# Patient Record
Sex: Female | Born: 1975 | Race: Black or African American | Hispanic: No | Marital: Single | Smoking: Former smoker
Health system: Southern US, Community
[De-identification: ages and names within clinical notes are randomized; demographics above are authoritative.]

## PROBLEM LIST (undated history)

## (undated) DIAGNOSIS — F329 Major depressive disorder, single episode, unspecified: Secondary | ICD-10-CM

## (undated) DIAGNOSIS — F32A Depression, unspecified: Secondary | ICD-10-CM

## (undated) DIAGNOSIS — E785 Hyperlipidemia, unspecified: Secondary | ICD-10-CM

## (undated) DIAGNOSIS — E119 Type 2 diabetes mellitus without complications: Secondary | ICD-10-CM

## (undated) DIAGNOSIS — I10 Essential (primary) hypertension: Secondary | ICD-10-CM

## (undated) DIAGNOSIS — F419 Anxiety disorder, unspecified: Secondary | ICD-10-CM

## (undated) HISTORY — DX: Hyperlipidemia, unspecified: E78.5

## (undated) HISTORY — DX: Major depressive disorder, single episode, unspecified: F32.9

## (undated) HISTORY — DX: Essential (primary) hypertension: I10

## (undated) HISTORY — DX: Anxiety disorder, unspecified: F41.9

## (undated) HISTORY — DX: Depression, unspecified: F32.A

## (undated) HISTORY — DX: Type 2 diabetes mellitus without complications: E11.9

---

## 2014-06-30 ENCOUNTER — Telehealth: Payer: Self-pay | Admitting: Family Medicine

## 2014-06-30 NOTE — Telephone Encounter (Signed)
Patient has medicaid and she is currently on lisinopril, metoprolol, novolog, prozac. Appointment scheduled for 5/10 with Jannifer Rodneyhristy Hawks, FNP.

## 2014-08-22 ENCOUNTER — Telehealth: Payer: Self-pay | Admitting: Family Medicine

## 2014-08-22 NOTE — Telephone Encounter (Signed)
[facility]10[facility]

## 2014-08-23 ENCOUNTER — Ambulatory Visit: Payer: Self-pay | Admitting: Family

## 2014-09-06 LAB — HM DIABETES EYE EXAM

## 2014-09-19 ENCOUNTER — Encounter: Payer: Self-pay | Admitting: *Deleted

## 2014-09-19 ENCOUNTER — Telehealth: Payer: Self-pay | Admitting: Family

## 2014-09-19 ENCOUNTER — Ambulatory Visit: Payer: Self-pay | Admitting: Family

## 2014-09-19 NOTE — Telephone Encounter (Signed)
Appointment given for 7/12 with Jannifer Rodneyhristy Hawks, FNP.

## 2014-09-23 ENCOUNTER — Encounter: Payer: Self-pay | Admitting: Family

## 2014-10-25 ENCOUNTER — Encounter: Payer: Self-pay | Admitting: Physician Assistant

## 2014-10-25 ENCOUNTER — Encounter (INDEPENDENT_AMBULATORY_CARE_PROVIDER_SITE_OTHER): Payer: Self-pay

## 2014-10-25 ENCOUNTER — Ambulatory Visit (INDEPENDENT_AMBULATORY_CARE_PROVIDER_SITE_OTHER): Payer: Medicaid Other | Admitting: Physician Assistant

## 2014-10-25 VITALS — BP 138/93 | HR 95 | Temp 97.0°F | Ht 63.0 in | Wt 212.0 lb

## 2014-10-25 DIAGNOSIS — E785 Hyperlipidemia, unspecified: Secondary | ICD-10-CM

## 2014-10-25 DIAGNOSIS — E1169 Type 2 diabetes mellitus with other specified complication: Secondary | ICD-10-CM | POA: Diagnosis not present

## 2014-10-25 DIAGNOSIS — E669 Obesity, unspecified: Secondary | ICD-10-CM | POA: Diagnosis not present

## 2014-10-25 DIAGNOSIS — I10 Essential (primary) hypertension: Secondary | ICD-10-CM | POA: Diagnosis not present

## 2014-10-25 DIAGNOSIS — F329 Major depressive disorder, single episode, unspecified: Secondary | ICD-10-CM | POA: Insufficient documentation

## 2014-10-25 DIAGNOSIS — F418 Other specified anxiety disorders: Secondary | ICD-10-CM

## 2014-10-25 DIAGNOSIS — E119 Type 2 diabetes mellitus without complications: Secondary | ICD-10-CM

## 2014-10-25 DIAGNOSIS — Z794 Long term (current) use of insulin: Secondary | ICD-10-CM

## 2014-10-25 DIAGNOSIS — R000 Tachycardia, unspecified: Secondary | ICD-10-CM | POA: Diagnosis not present

## 2014-10-25 DIAGNOSIS — F419 Anxiety disorder, unspecified: Secondary | ICD-10-CM

## 2014-10-25 DIAGNOSIS — F32A Depression, unspecified: Secondary | ICD-10-CM

## 2014-10-25 LAB — POCT CBC
Granulocyte percent: 56.3 %G (ref 37–80)
HCT, POC: 41.1 % (ref 37.7–47.9)
Hemoglobin: 12.9 g/dL (ref 12.2–16.2)
LYMPH, POC: 2.9 (ref 0.6–3.4)
MCH: 26.1 pg — AB (ref 27–31.2)
MCHC: 31.5 g/dL — AB (ref 31.8–35.4)
MCV: 82.7 fL (ref 80–97)
MPV: 6.9 fL (ref 0–99.8)
POC Granulocyte: 4.2 (ref 2–6.9)
POC LYMPH %: 38.6 % (ref 10–50)
Platelet Count, POC: 483 10*3/uL — AB (ref 142–424)
RBC: 4.97 M/uL (ref 4.04–5.48)
RDW, POC: 14 %
WBC: 7.4 10*3/uL (ref 4.6–10.2)

## 2014-10-25 LAB — POCT GLYCOSYLATED HEMOGLOBIN (HGB A1C): Hemoglobin A1C: 13.3

## 2014-10-25 MED ORDER — FLUOXETINE HCL 20 MG PO TABS: 20.0000 mg | ORAL_TABLET | Freq: Every day | ORAL | Status: DC

## 2014-10-25 MED ORDER — METOPROLOL SUCCINATE ER 25 MG PO TB24: 25.0000 mg | ORAL_TABLET | Freq: Every day | ORAL | Status: DC

## 2014-10-25 MED ORDER — ALPRAZOLAM 0.25 MG PO TABS: 0.2500 mg | ORAL_TABLET | Freq: Two times a day (BID) | ORAL | Status: DC | PRN

## 2014-10-25 NOTE — Progress Notes (Signed)
Subjective:    Patient ID: Kiara Mccall, female    DOB: 1976/01/24, 39 y.o.   MRN: [id number]  HPI 39 y/o female presents for establishment of care. She has previously diagnosed HTN, DM type 2 (insulin dependent), hyperlipidemia and anxiety with depressed mood. She has not been treated in 6 months because her Dr said that he could not see her anymore d/t noncompliance of follow up according to patient.   She occasionally takes her stepdad's antihypertensive, Metoprolol 84m and Lisinopril 167m- last dose was yesterday.   Strong family h/o kidney disease, HTN, DM with secondary blindness.    Review of Systems  Constitutional: Negative for fever, chills, diaphoresis, fatigue and unexpected weight change.  Eyes:       Recent eye exam 2 months ago. WNL    Respiratory: Positive for cough and shortness of breath (when outside ).   Cardiovascular: Positive for palpitations. Negative for chest pain and leg swelling.  Gastrointestinal: Negative.  Negative for abdominal pain, constipation and abdominal distention.  Endocrine: Positive for polydipsia. Negative for cold intolerance, heat intolerance, polyphagia and polyuria.  Genitourinary: Positive for urgency. Negative for dysuria, frequency, hematuria, vaginal bleeding and vaginal discharge.  Musculoskeletal: Negative.   Skin: Negative.   Allergic/Immunologic: Negative.   Neurological: Positive for light-headedness and headaches. Negative for dizziness and syncope.  Hematological: Negative.   Psychiatric/Behavioral: Positive for dysphoric mood. Negative for suicidal ideas. The patient is nervous/anxious.        Objective:   Physical Exam  Constitutional: She is oriented to person, place, and time. She appears well-developed and well-nourished.  HENT:  Head: Normocephalic and atraumatic.  Cardiovascular: Normal rate, regular rhythm, normal heart sounds and intact distal pulses.  Exam reveals no gallop and no friction rub.   No  murmur heard. Pulmonary/Chest: Effort normal and breath sounds normal. No respiratory distress. She has no wheezes. She has no rales. She exhibits no tenderness.  Musculoskeletal: Normal range of motion. She exhibits no edema or tenderness.  Neurological: She is alert and oriented to person, place, and time. She displays normal reflexes. No cranial nerve deficit. She exhibits normal muscle tone. Coordination normal.  5 point discrimination WNL bilateral feet   Psychiatric: She has a normal mood and affect. Her behavior is normal. Judgment and thought content normal.  Nursing note and vitals reviewed.         Assessment & Plan:  1. Type 2 diabetes mellitus treated with insulin  - POCT CBC - Thyroid Panel With TSH - CMP14+EGFR - POCT glycosylated hemoglobin (Hb A1C)  2. Essential hypertension  - Hepatic function panel - metoprolol succinate (TOPROL-XL) 25 MG 24 hr tablet; Take 1 tablet (25 mg total) by mouth daily.  Dispense: 30 tablet; Refill: 3  3. Hyperlipidemia associated with type 2 diabetes mellitus  - Lipid panel  4. Diabetes mellitus type 2, insulin dependent   5. Hyperlipidemia   6. Anxiety and depression  - FLUoxetine (PROZAC) 20 MG tablet; Take 1 tablet (20 mg total) by mouth daily.  Dispense: 30 tablet; Refill: 3 - ALPRAZolam (XANAX) 0.25 MG tablet; Take 1 tablet (0.25 mg total) by mouth 2 (two) times daily as needed for anxiety.  Dispense: 60 tablet; Refill: 1  7. Obesity   8. Tachycardia  - metoprolol succinate (TOPROL-XL) 25 MG 24 hr tablet; Take 1 tablet (25 mg total) by mouth daily.  Dispense: 30 tablet; Refill: 3  Will have patient f/u with me in 2 weeks to recheck BP and with  Clinical pharmacist asap for diabetic mgmt pending lab results.   Albaraa Swingle A. Benjamin Stain PA-C

## 2014-10-26 LAB — THYROID PANEL WITH TSH
Free Thyroxine Index: 2.2 (ref 1.2–4.9)
T3 Uptake Ratio: 28 % (ref 24–39)
T4, Total: 8 ug/dL (ref 4.5–12.0)
TSH: 1.32 u[IU]/mL (ref 0.450–4.500)

## 2014-10-26 LAB — CMP14+EGFR
A/G RATIO: 1.1 (ref 1.1–2.5)
ALBUMIN: 3.7 g/dL (ref 3.5–5.5)
ALK PHOS: 76 IU/L (ref 39–117)
ALT: 13 IU/L (ref 0–32)
AST: 11 IU/L (ref 0–40)
BILIRUBIN TOTAL: 0.3 mg/dL (ref 0.0–1.2)
BUN / CREAT RATIO: 17 (ref 8–20)
BUN: 9 mg/dL (ref 6–20)
CALCIUM: 9.1 mg/dL (ref 8.7–10.2)
CO2: 22 mmol/L (ref 18–29)
Chloride: 96 mmol/L — ABNORMAL LOW (ref 97–108)
Creatinine, Ser: 0.52 mg/dL — ABNORMAL LOW (ref 0.57–1.00)
GFR calc non Af Amer: 122 mL/min/{1.73_m2} (ref >59)
GFR, EST AFRICAN AMERICAN: 141 mL/min/{1.73_m2} (ref >59)
Globulin, Total: 3.3 g/dL (ref 1.5–4.5)
Glucose: 341 mg/dL — ABNORMAL HIGH (ref 65–99)
POTASSIUM: 4.3 mmol/L (ref 3.5–5.2)
SODIUM: 135 mmol/L (ref 134–144)
TOTAL PROTEIN: 7 g/dL (ref 6.0–8.5)

## 2014-10-26 LAB — HEPATIC FUNCTION PANEL: Bilirubin, Direct: 0.1 mg/dL (ref 0.00–0.40)

## 2014-10-26 LAB — LIPID PANEL
CHOL/HDL RATIO: 7.2 ratio — AB (ref 0.0–4.4)
CHOLESTEROL TOTAL: 230 mg/dL — AB (ref 100–199)
HDL: 32 mg/dL — ABNORMAL LOW (ref >39)
LDL Calculated: 162 mg/dL — ABNORMAL HIGH (ref 0–99)
TRIGLYCERIDES: 179 mg/dL — AB (ref 0–149)
VLDL Cholesterol Cal: 36 mg/dL (ref 5–40)

## 2014-10-28 ENCOUNTER — Other Ambulatory Visit: Payer: Self-pay | Admitting: Family

## 2014-10-28 MED ORDER — METFORMIN HCL 1000 MG PO TABS: 1000.0000 mg | ORAL_TABLET | Freq: Two times a day (BID) | ORAL | Status: DC

## 2014-10-28 MED ORDER — ATORVASTATIN CALCIUM 40 MG PO TABS: 40.0000 mg | ORAL_TABLET | Freq: Every day | ORAL | Status: DC

## 2014-11-09 ENCOUNTER — Encounter: Payer: Self-pay | Admitting: Family

## 2014-11-09 ENCOUNTER — Ambulatory Visit (INDEPENDENT_AMBULATORY_CARE_PROVIDER_SITE_OTHER): Payer: Medicaid Other | Admitting: Family

## 2014-11-09 VITALS — BP 138/96 | HR 96 | Temp 97.4°F | Ht 63.0 in | Wt 206.4 lb

## 2014-11-09 DIAGNOSIS — Z794 Long term (current) use of insulin: Secondary | ICD-10-CM

## 2014-11-09 DIAGNOSIS — E119 Type 2 diabetes mellitus without complications: Secondary | ICD-10-CM

## 2014-11-09 DIAGNOSIS — I10 Essential (primary) hypertension: Secondary | ICD-10-CM

## 2014-11-09 MED ORDER — INSULIN LISPRO 100 UNIT/ML ~~LOC~~ SOLN: 20.0000 [IU] | Freq: Three times a day (TID) | SUBCUTANEOUS | Status: DC

## 2014-11-09 MED ORDER — LISINOPRIL 20 MG PO TABS: 20.0000 mg | ORAL_TABLET | Freq: Every day | ORAL | Status: DC

## 2014-11-09 NOTE — Progress Notes (Signed)
Subjective:    Patient ID: Kiara Mccall, female    DOB: 06-22-1975, 39 y.o.   MRN: [id number]  Pt presents to the office for 2 week recheck for diabetes and hypertension. PT has been a diabetic for years, but did not have a doctor so she stopped all of her medication including her metformin and Humalog 40 units TID. Pt was restarted on her metformin, but states she does not have a blood glucose meter at home. Pt has an appt with Tammy August 1.  Diabetes She presents for her follow-up diabetic visit. She has type 2 diabetes mellitus. Her disease course has been worsening. Pertinent negatives for hypoglycemia include no confusion, dizziness or headaches. Pertinent negatives for diabetes include no blurred vision, no foot paresthesias, no foot ulcerations and no visual change. Symptoms are worsening. Pertinent negatives for diabetic complications include no CVA, heart disease, nephropathy or peripheral neuropathy. Risk factors for coronary artery disease include diabetes mellitus, obesity, family history and dyslipidemia. Current diabetic treatment includes oral agent (dual therapy). She is compliant with treatment some of the time. An ACE inhibitor/angiotensin II receptor blocker is not being taken. Eye exam is current.  Hypertension This is a chronic problem. The current episode started more than 1 year ago. The problem has been waxing and waning since onset. The problem is uncontrolled. Associated symptoms include palpitations. Pertinent negatives include no anxiety, blurred vision, headaches, peripheral edema or shortness of breath. Risk factors for coronary artery disease include diabetes mellitus, dyslipidemia, family history, obesity and sedentary lifestyle. Past treatments include beta blockers. The current treatment provides mild improvement. There is no history of kidney disease, CAD/MI, CVA or heart failure.      Review of Systems  Constitutional: Negative.   HENT: Negative.   Eyes:  Negative.  Negative for blurred vision.  Respiratory: Negative.  Negative for shortness of breath.   Cardiovascular: Positive for palpitations.  Gastrointestinal: Negative.   Endocrine: Negative.   Genitourinary: Negative.   Musculoskeletal: Negative.   Neurological: Negative.  Negative for dizziness and headaches.  Hematological: Negative.   Psychiatric/Behavioral: Negative.  Negative for confusion.  All other systems reviewed and are negative.      Objective:   Physical Exam  Constitutional: She is oriented to person, place, and time. She appears well-developed and well-nourished. No distress.  HENT:  Head: Normocephalic and atraumatic.  Right Ear: External ear normal.  Left Ear: External ear normal.  Nose: Nose normal.  Mouth/Throat: Oropharynx is clear and moist.  Eyes: Pupils are equal, round, and reactive to light.  Neck: Normal range of motion. Neck supple. No thyromegaly present.  Cardiovascular: Normal rate, regular rhythm, normal heart sounds and intact distal pulses.   No murmur heard. Pulmonary/Chest: Effort normal and breath sounds normal. No respiratory distress. She has no wheezes.  Abdominal: Soft. Bowel sounds are normal. She exhibits no distension. There is no tenderness.  Musculoskeletal: Normal range of motion. She exhibits no edema or tenderness.  Neurological: She is alert and oriented to person, place, and time. She has normal reflexes. No cranial nerve deficit.  Skin: Skin is warm and dry.  Psychiatric: She has a normal mood and affect. Her behavior is normal. Judgment and thought content normal.  Vitals reviewed.   BP 138/96 mmHg  Pulse 96  Temp(Src) 97.4 F (36.3 C) (Oral)  Ht _0  (1.6 m)  Wt 206 lb 6.4 oz (93.622 kg)  BMI 36.57 kg/m2  LMP 10/01/2014  See Diabetic foot note  Assessment & Plan:  1. Diabetes mellitus type 2, insulin dependent -PT states she was on Humalog 40 units TID 6 months ago- I restarted Humalog 20 units TID -I  gave pt blood glucose monitor and help pt set meter up -Encouraged low carb diet -keep appt with Clinical pharmacists  - POCT glycosylated hemoglobin (Hb A1C) - POCT UA - Microalbumin - CMP14+EGFR - lisinopril (PRINIVIL,ZESTRIL) 20 MG tablet; Take 1 tablet (20 mg total) by mouth daily.  Dispense: 90 tablet; Refill: 3 - insulin lispro (HUMALOG) 100 UNIT/ML injection; Inject 0.2 mLs (20 Units total) into the skin 3 (three) times daily with meals.  Dispense: 10 mL; Refill: 11  2. Essential hypertension -Pt started on Lisinopril -Dash diet information given -Exercise encouraged - Stress Management  -Continue current meds -RTO in 2 weeks - CMP14+EGFR - lisinopril (PRINIVIL,ZESTRIL) 20 MG tablet; Take 1 tablet (20 mg total) by mouth daily.  Dispense: 90 tablet; Refill: 3   Continue all meds Labs pending Health Maintenance reviewed Diet and exercise encouraged RTO 2 weeks for HTN and diabetes- Keep appt with Clinical Pharmacists   Evelina Dun, FNP

## 2014-11-09 NOTE — Patient Instructions (Signed)

## 2014-11-14 ENCOUNTER — Telehealth: Payer: Self-pay | Admitting: Pharmacist

## 2014-11-14 ENCOUNTER — Ambulatory Visit: Payer: Medicaid Other

## 2014-11-14 NOTE — Telephone Encounter (Signed)
Appt is made for August 5th at [facility] and patient is notified.

## 2014-11-18 ENCOUNTER — Encounter: Payer: Self-pay | Admitting: Pharmacist

## 2014-11-18 ENCOUNTER — Ambulatory Visit (INDEPENDENT_AMBULATORY_CARE_PROVIDER_SITE_OTHER): Payer: Medicaid Other | Admitting: Pharmacist

## 2014-11-18 DIAGNOSIS — E669 Obesity, unspecified: Secondary | ICD-10-CM

## 2014-11-18 DIAGNOSIS — E119 Type 2 diabetes mellitus without complications: Secondary | ICD-10-CM | POA: Diagnosis not present

## 2014-11-18 DIAGNOSIS — Z794 Long term (current) use of insulin: Secondary | ICD-10-CM

## 2014-11-18 DIAGNOSIS — I10 Essential (primary) hypertension: Secondary | ICD-10-CM

## 2014-11-18 DIAGNOSIS — E785 Hyperlipidemia, unspecified: Secondary | ICD-10-CM

## 2014-11-18 MED ORDER — ACCU-CHEK AVIVA PLUS W/DEVICE KIT: PACK | Status: DC

## 2014-11-18 MED ORDER — LANCETS MICRO THIN 33G MISC: Status: DC

## 2014-11-18 MED ORDER — GLUCOSE BLOOD VI STRP: ORAL_STRIP | Status: DC

## 2014-11-18 MED ORDER — ASPIRIN EC 81 MG PO TBEC: 81.0000 mg | DELAYED_RELEASE_TABLET | Freq: Every day | ORAL | Status: AC

## 2014-11-18 MED ORDER — INSULIN GLARGINE 100 UNIT/ML SOLOSTAR PEN: 20.0000 [IU] | PEN_INJECTOR | Freq: Every day | SUBCUTANEOUS | Status: DC

## 2014-11-18 NOTE — Progress Notes (Signed)
Subjective:    Kiara Mccall is a 39 y.o. female who presents for an initial evaluation of diabetes mellitus.  She was initially diagnosed with diabetes 2 years ago.  It is unclear if she has type 2 or type 1 DM as she has always been prescribed insulin.  When she presented to our office 2 weeks ago she has been out of all medications for over 1 month.   Current symptoms/problems include hyperglycemia and nausea and have been unchanged.   Known diabetic complications: none Cardiovascular risk factors: diabetes mellitus, dyslipidemia, family history of premature cardiovascular disease, hypertension and obesity (BMI >= 30 kg/m2) Current diabetic medications include metformin  1 tablet bid and humalog 20 units tid prior to meals.  Patient had taken Lantus for a short period about 1 year ago..   Eye exam current (within one year): yes  - per patient, requesting records from Dr Mayford Knife in [place].  Weight trend: increasing steadily Prior visit with dietician: no Current diet: in general, an "unhealthy" diet Current exercise: none  Current monitoring regimen: home blood tests - three times daily Home blood sugar records: patient reports BG 153 - 220 over the last week Any episodes of hypoglycemia? No reading less than 70 but patient feels that BG is low when it is in the 150's probably because she has had very elevated BG for a very long time and her body is not use to BG close to normal.  Is She on ACE inhibitor or angiotensin II receptor blocker?  Yes  lisinopril (Prinivil)    The following portions of the patient's history were reviewed and updated as appropriate: allergies, current medications, past family history, past medical history, past social history, past surgical history and problem list.   Objective:    LMP 10/01/2014  Lab Review GLUCOSE (mg/dL)  Date Value  16/01/9603 341*   CO2 (mmol/L)  Date Value  10/25/2014 22   BUN (mg/dL)  Date Value  54/12/8117 9    CREATININE, SER (mg/dL)  Date Value  14/78/2956 0.52*   A1c = 13.3% (10/25/2014) FBG in office today = 257  ssessment:    Diabetes Mellitus type II, under inadequate control.   HTN Hyperlipidemia  Plan:    1.  Rx changes as as follows:  Start Lantus 20 units once daily  Change Humalog to 10 units prior to meals if BG is 150 or higher  Add ASA  take 1 tablet daily  Continue other medications as instructed  Rx given for Aviva plus glucometer which is preferred by medicaid - with test strips and lancet - patient is to continue to check BG tid prior to each meal and if needed for suspected hyper or hypoglycemia. 2.  Education: Reviewed 'ABCs' of diabetes management (respective goals in parentheses):  A1C (<7), blood pressure (<130/80), and cholesterol (LDL <100). 3.  Spent 30 minutes educating about CHO content of various food and discussing effects on BG.  Education provided about recommended serving sizes.  Recommended CHO content of meals be 45 to 60 grams of CHO and for snack 15 grams of CHO or less.   4.  Discussed pregnancy prevention.  As patient is still able to become pregnant I discussed with her that she is taking 2 medications - statin and ACE that are not recommended during pregnancy.  Discuss options for pregnancy prevention.  5.  Increase physical activity - recommended 150 minutes weekly 6.  Checking GAD antibiody and C-Peptide to clarify type of diabetes (1 vs  2)   Orders Placed This Encounter  Procedures  . Glutamic acid decarboxylase auto abs  . C-peptide    7.  RTC in 4 days to see PCP.  I will see patient back in about 1 month or sooner if needed.  She is advised to call office if she has any questions or concerns.  Henrene Pastor, PharmD, CPP, CDE

## 2014-11-18 NOTE — Patient Instructions (Addendum)
Diabetes and Standards of Medical Care   Diabetes is complicated. You may find that your diabetes team includes a dietitian, nurse, diabetes educator, eye doctor, and more. To help everyone know what is going on and to help you get the care you deserve, the following schedule of care was developed to help keep you on track. Below are the tests, exams, vaccines, medicines, education, and plans you will need.  Blood Glucose Goals Prior to meals = 80 - 130 Within 2 hours of the start of a meal = less than 180  HbA1c test (goal is less than 7.0% - your last value was %) This test shows how well you have controlled your glucose over the past 2 to 3 months. It is used to see if your diabetes management plan needs to be adjusted.   It is performed at least 2 times a year if you are meeting treatment goals.  It is performed 4 times a year if therapy has changed or if you are not meeting treatment goals.  Blood pressure test  This test is performed at every routine medical visit. The goal is less than 140/90 mmHg for most people, but 130/80 mmHg in some cases. Ask your health care provider about your goal.  Dental exam  Follow up with the dentist regularly.  Eye exam  If you are diagnosed with type 1 diabetes as a child, get an exam upon reaching the age of 10 years or older and have had diabetes for 3 to 5 years. Yearly eye exams are recommended after that initial eye exam.  If you are diagnosed with type 1 diabetes as an adult, get an exam within 5 years of diagnosis and then yearly.  If you are diagnosed with type 2 diabetes, get an exam as soon as possible after the diagnosis and then yearly.  Foot care exam  Visual foot exams are performed at every routine medical visit. The exams check for cuts, injuries, or other problems with the feet.  A comprehensive foot exam should be done yearly. This includes visual inspection as well as assessing foot pulses and testing for loss of  sensation.  Check your feet nightly for cuts, injuries, or other problems with your feet. Tell your health care provider if anything is not healing.  Kidney function test (urine microalbumin)  This test is performed once a year.  Type 1 diabetes: The first test is performed 5 years after diagnosis.  Type 2 diabetes: The first test is performed at the time of diagnosis.  A serum creatinine and estimated glomerular filtration rate (eGFR) test is done once a year to assess the level of chronic kidney disease (CKD), if present.  Lipid profile (cholesterol, HDL, LDL, triglycerides)  Performed every 5 years for most people.  The goal for LDL is less than 100 mg/dL. If you are at high risk, the goal is less than 70 mg/dL.  The goal for HDL is 40 mg/dL to 50 mg/dL for men and 50 mg/dL to 60 mg/dL for women. An HDL cholesterol of 60 mg/dL or higher gives some protection against heart disease.  The goal for triglycerides is less than 150 mg/dL.  Influenza vaccine, pneumococcal vaccine, and hepatitis B vaccine  The influenza vaccine is recommended yearly.  The pneumococcal vaccine is generally given once in a lifetime. However, there are some instances when another vaccination is recommended. Check with your health care provider.  The hepatitis B vaccine is also recommended for adults with diabetes.    Diabetes self-management education  Education is recommended at diagnosis and ongoing as needed.  Treatment plan  Your treatment plan is reviewed at every medical visit.  Try to increase physical activity - recommend 150 minutes per week (30 minutes 5 times weekly)

## 2014-11-21 ENCOUNTER — Encounter: Payer: Self-pay | Admitting: Family

## 2014-11-21 LAB — C-PEPTIDE: C-Peptide: 3.2 ng/mL (ref 1.1–4.4)

## 2014-11-21 LAB — GLUTAMIC ACID DECARBOXYLASE AUTO ABS: Glutamic Acid Decarb Ab: <5 U/mL (ref 0.0–5.0)

## 2014-11-22 ENCOUNTER — Encounter: Payer: Self-pay | Admitting: Family

## 2014-11-22 ENCOUNTER — Ambulatory Visit (INDEPENDENT_AMBULATORY_CARE_PROVIDER_SITE_OTHER): Payer: Medicaid Other | Admitting: Family

## 2014-11-22 VITALS — BP 156/103 | HR 87 | Temp 97.8°F | Ht 63.0 in | Wt 211.2 lb

## 2014-11-22 DIAGNOSIS — Z23 Encounter for immunization: Secondary | ICD-10-CM

## 2014-11-22 DIAGNOSIS — I10 Essential (primary) hypertension: Secondary | ICD-10-CM

## 2014-11-22 DIAGNOSIS — Z794 Long term (current) use of insulin: Secondary | ICD-10-CM | POA: Diagnosis not present

## 2014-11-22 DIAGNOSIS — E119 Type 2 diabetes mellitus without complications: Secondary | ICD-10-CM

## 2014-11-22 LAB — POCT GLYCOSYLATED HEMOGLOBIN (HGB A1C): HEMOGLOBIN A1C: 10.7

## 2014-11-22 LAB — POCT UA - MICROALBUMIN: Microalbumin Ur, POC: 50 mg/L

## 2014-11-22 LAB — GLUCOSE, POCT (MANUAL RESULT ENTRY): POC Glucose: 195 mg/dl — AB (ref 70–99)

## 2014-11-22 MED ORDER — LISINOPRIL-HYDROCHLOROTHIAZIDE 20-12.5 MG PO TABS: 2.0000 | ORAL_TABLET | Freq: Every day | ORAL | Status: AC

## 2014-11-22 NOTE — Addendum Note (Signed)
Addended by: Tommas Olp on: 11/22/2014 09:41 AM   Modules accepted: Orders

## 2014-11-22 NOTE — Addendum Note (Signed)
Addended by: Almeta Monas on: 11/22/2014 09:29 AM   Modules accepted: Orders

## 2014-11-22 NOTE — Progress Notes (Signed)
Subjective:    Patient ID: Kiara Mccall, female    DOB: 04-May-1975, 39 y.o.   MRN: [id number]  Pt presents to the office today to recheck HTN. PT's BP is not at goal. Hypertension This is a chronic problem. The current episode started more than 1 year ago. The problem has been waxing and waning since onset. The problem is uncontrolled. Pertinent negatives include no anxiety, blurred vision, headaches, palpitations, peripheral edema or shortness of breath. Risk factors for coronary artery disease include dyslipidemia, obesity, post-menopausal state and sedentary lifestyle. Past treatments include ACE inhibitors and beta blockers. There is no history of kidney disease, CAD/MI, CVA or heart failure.  Diabetes She presents for her follow-up diabetic visit. She has type 2 diabetes mellitus. There are no hypoglycemic associated symptoms. Pertinent negatives for hypoglycemia include no confusion or headaches. Pertinent negatives for diabetes include no blurred vision, no foot paresthesias, no foot ulcerations and no visual change. There are no hypoglycemic complications. Symptoms are stable. Pertinent negatives for diabetic complications include no CVA, heart disease, nephropathy or peripheral neuropathy. Current diabetic treatment includes insulin injections and oral agent (monotherapy). She is compliant with treatment all of the time. She is following a generally healthy diet. Her breakfast blood glucose range is generally 140-180 mg/dl. An ACE inhibitor/angiotensin II receptor blocker is being taken. Eye exam is current (5 months ago).      Review of Systems  Constitutional: Negative.   HENT: Negative.   Eyes: Negative.  Negative for blurred vision.  Respiratory: Negative.  Negative for shortness of breath.   Cardiovascular: Negative.  Negative for palpitations.  Gastrointestinal: Negative.   Endocrine: Negative.   Genitourinary: Negative.   Musculoskeletal: Negative.   Neurological:  Negative.  Negative for headaches.  Hematological: Negative.   Psychiatric/Behavioral: Negative.  Negative for confusion.  All other systems reviewed and are negative.      Objective:   Physical Exam  Constitutional: She is oriented to person, place, and time. She appears well-developed and well-nourished. No distress.  HENT:  Head: Normocephalic and atraumatic.  Right Ear: External ear normal.  Left Ear: External ear normal.  Nose: Nose normal.  Mouth/Throat: Oropharynx is clear and moist.  Eyes: Pupils are equal, round, and reactive to light.  Neck: Normal range of motion. Neck supple. No thyromegaly present.  Cardiovascular: Normal rate, regular rhythm, normal heart sounds and intact distal pulses.   No murmur heard. Pulmonary/Chest: Effort normal and breath sounds normal. No respiratory distress. She has no wheezes.  Abdominal: Soft. Bowel sounds are normal. She exhibits no distension. There is no tenderness.  Musculoskeletal: Normal range of motion. She exhibits no edema or tenderness.  Neurological: She is alert and oriented to person, place, and time. She has normal reflexes. No cranial nerve deficit.  Skin: Skin is warm and dry.  Psychiatric: She has a normal mood and affect. Her behavior is normal. Judgment and thought content normal.  Vitals reviewed.   BP 156/103 mmHg  Pulse 87  Temp(Src) 97.8 F (36.6 C) (Oral)  Ht 5\' 3"  (1.6 m)  Wt 211 lb 3.2 oz (95.8 kg)  BMI 37.42 kg/m2  LMP 10/01/2014       Assessment & Plan:  1. Essential hypertension -Pt's lisinopril increased to 40 mg and added HCTZ 25 mg today -Dash diet information given -Exercise encouraged - Stress Management  -Continue current meds -RTO in 2 weeks - lisinopril-hydrochlorothiazide (ZESTORETIC) 20-12.5 MG per tablet; Take 2 tablets by mouth daily.  Dispense: 90 tablet; Refill:  3 - CMP14+EGFR  2. Diabetes mellitus type 2, insulin dependent -Pt just saw Tammy on Friday- Pt reports blood glucose  improving- Will not adjust meds at this time until lab work -Low carb diet - POCT glycosylated hemoglobin (Hb A1C) - POCT UA - Microalbumin - CMP14+EGFR  Evelina Dun, FNP

## 2014-11-22 NOTE — Patient Instructions (Signed)
DASH Eating Plan DASH stands for "Dietary Approaches to Stop Hypertension." The DASH eating plan is a healthy eating plan that has been shown to reduce high blood pressure (hypertension). Additional health benefits may include reducing the risk of type 2 diabetes mellitus, heart disease, and stroke. The DASH eating plan may also help with weight loss. WHAT DO I NEED TO KNOW ABOUT THE DASH EATING PLAN? For the DASH eating plan, you will follow these general guidelines:  Choose foods with a percent daily value for sodium of less than 5% (as listed on the food label).  Use salt-free seasonings or herbs instead of table salt or sea salt.  Check with your health care provider or pharmacist before using salt substitutes.  Eat lower-sodium products, often labeled as "lower sodium" or "no salt added."  Eat fresh foods.  Eat more vegetables, fruits, and low-fat dairy products.  Choose whole grains. Look for the word "whole" as the first word in the ingredient list.  Choose fish and skinless chicken or turkey more often than red meat. Limit fish, poultry, and meat to 6 oz (170 g) each day.  Limit sweets, desserts, sugars, and sugary drinks.  Choose heart-healthy fats.  Limit cheese to 1 oz (28 g) per day.  Eat more home-cooked food and less restaurant, buffet, and fast food.  Limit fried foods.  Cook foods using methods other than frying.  Limit canned vegetables. If you do use them, rinse them well to decrease the sodium.  When eating at a restaurant, ask that your food be prepared with less salt, or no salt if possible. WHAT FOODS CAN I EAT? Seek help from a dietitian for individual calorie needs. Grains Whole grain or whole wheat bread. Brown rice. Whole grain or whole wheat pasta. Quinoa, bulgur, and whole grain cereals. Low-sodium cereals. Corn or whole wheat flour tortillas. Whole grain cornbread. Whole grain crackers. Low-sodium crackers. Vegetables Fresh or frozen vegetables  (raw, steamed, roasted, or grilled). Low-sodium or reduced-sodium tomato and vegetable juices. Low-sodium or reduced-sodium tomato sauce and paste. Low-sodium or reduced-sodium canned vegetables.  Fruits All fresh, canned (in natural juice), or frozen fruits. Meat and Other Protein Products Ground beef (85% or leaner), grass-fed beef, or beef trimmed of fat. Skinless chicken or turkey. Ground chicken or turkey. Pork trimmed of fat. All fish and seafood. Eggs. Dried beans, peas, or lentils. Unsalted nuts and seeds. Unsalted canned beans. Dairy Low-fat dairy products, such as skim or 1% milk, 2% or reduced-fat cheeses, low-fat ricotta or cottage cheese, or plain low-fat yogurt. Low-sodium or reduced-sodium cheeses. Fats and Oils Tub margarines without trans fats. Light or reduced-fat mayonnaise and salad dressings (reduced sodium). Avocado. Safflower, olive, or canola oils. Natural peanut or almond butter. Other Unsalted popcorn and pretzels. The items listed above may not be a complete list of recommended foods or beverages. Contact your dietitian for more options. WHAT FOODS ARE NOT RECOMMENDED? Grains White bread. White pasta. White rice. Refined cornbread. Bagels and croissants. Crackers that contain trans fat. Vegetables Creamed or fried vegetables. Vegetables in a cheese sauce. Regular canned vegetables. Regular canned tomato sauce and paste. Regular tomato and vegetable juices. Fruits Dried fruits. Canned fruit in light or heavy syrup. Fruit juice. Meat and Other Protein Products Fatty cuts of meat. Ribs, chicken wings, bacon, sausage, bologna, salami, chitterlings, fatback, hot dogs, bratwurst, and packaged luncheon meats. Salted nuts and seeds. Canned beans with salt. Dairy Whole or 2% milk, cream, half-and-half, and cream cheese. Whole-fat or sweetened yogurt. Full-fat   cheeses or blue cheese. Nondairy creamers and whipped toppings. Processed cheese, cheese spreads, or cheese  curds. Condiments Onion and garlic salt, seasoned salt, table salt, and sea salt. Canned and packaged gravies. Worcestershire sauce. Tartar sauce. Barbecue sauce. Teriyaki sauce. Soy sauce, including reduced sodium. Steak sauce. Fish sauce. Oyster sauce. Cocktail sauce. Horseradish. Ketchup and mustard. Meat flavorings and tenderizers. Bouillon cubes. Hot sauce. Tabasco sauce. Marinades. Taco seasonings. Relishes. Fats and Oils Butter, stick margarine, lard, shortening, ghee, and bacon fat. Coconut, palm kernel, or palm oils. Regular salad dressings. Other Pickles and olives. Salted popcorn and pretzels. The items listed above may not be a complete list of foods and beverages to avoid. Contact your dietitian for more information. WHERE CAN I FIND MORE INFORMATION? National Heart, Lung, and [employer] Institute: www.nhlbi.nih.gov/health/health-topics/topics/dash/ Document Released: 03/21/2011 Document Revised: 08/16/2013 Document Reviewed: 02/03/2013 ExitCare Patient Information 2015 [employer]. This information is not intended to replace advice given to you by your health care provider. Make sure you discuss any questions you have with your health care provider. Hypertension Hypertension, commonly called high blood pressure, is when the force of blood pumping through your arteries is too strong. Your arteries are the blood vessels that carry blood from your heart throughout your body. A blood pressure reading consists of a higher number over a lower number, such as 110/72. The higher number (systolic) is the pressure inside your arteries when your heart pumps. The lower number (diastolic) is the pressure inside your arteries when your heart relaxes. Ideally you want your blood pressure below 120/80. Hypertension forces your heart to work harder to pump blood. Your arteries may become narrow or stiff. Having hypertension puts you at risk for heart disease, stroke, and other problems.  RISK  FACTORS Some risk factors for high blood pressure are controllable. Others are not.  Risk factors you cannot control include:   Race. You may be at higher risk if you are African American.  Age. Risk increases with age.  Gender. Men are at higher risk than women before age 45 years. After age 65, women are at higher risk than men. Risk factors you can control include:  Not getting enough exercise or physical activity.  Being overweight.  Getting too much fat, sugar, calories, or salt in your diet.  Drinking too much alcohol. SIGNS AND SYMPTOMS Hypertension does not usually cause signs or symptoms. Extremely high blood pressure (hypertensive crisis) may cause headache, anxiety, shortness of breath, and nosebleed. DIAGNOSIS  To check if you have hypertension, your health care provider will measure your blood pressure while you are seated, with your arm held at the level of your heart. It should be measured at least twice using the same arm. Certain conditions can cause a difference in blood pressure between your right and left arms. A blood pressure reading that is higher than normal on one occasion does not mean that you need treatment. If one blood pressure reading is high, ask your health care provider about having it checked again. TREATMENT  Treating high blood pressure includes making lifestyle changes and possibly taking medicine. Living a healthy lifestyle can help lower high blood pressure. You may need to change some of your habits. Lifestyle changes may include:  Following the DASH diet. This diet is high in fruits, vegetables, and whole grains. It is low in salt, red meat, and added sugars.  Getting at least 2 hours of brisk physical activity every week.  Losing weight if necessary.  Not smoking.  Limiting   alcoholic beverages.  Learning ways to reduce stress. If lifestyle changes are not enough to get your blood pressure under control, your health care provider may  prescribe medicine. You may need to take more than one. Work closely with your health care provider to understand the risks and benefits. HOME CARE INSTRUCTIONS  Have your blood pressure rechecked as directed by your health care provider.   Take medicines only as directed by your health care provider. Follow the directions carefully. Blood pressure medicines must be taken as prescribed. The medicine does not work as well when you skip doses. Skipping doses also puts you at risk for problems.   Do not smoke.   Monitor your blood pressure at home as directed by your health care provider. SEEK MEDICAL CARE IF:   You think you are having a reaction to medicines taken.  You have recurrent headaches or feel dizzy.  You have swelling in your ankles.  You have trouble with your vision. SEEK IMMEDIATE MEDICAL CARE IF:  You develop a severe headache or confusion.  You have unusual weakness, numbness, or feel faint.  You have severe chest or abdominal pain.  You vomit repeatedly.  You have trouble breathing. MAKE SURE YOU:   Understand these instructions.  Will watch your condition.  Will get help right away if you are not doing well or get worse. Document Released: 04/01/2005 Document Revised: 08/16/2013 Document Reviewed: 01/22/2013 ExitCare Patient Information 2015 [employer]. This information is not intended to replace advice given to you by your health care provider. Make sure you discuss any questions you have with your health care provider.  

## 2014-11-22 NOTE — Addendum Note (Signed)
Addended by: Tommas Olp on: 11/22/2014 09:33 AM   Modules accepted: Orders

## 2014-11-23 ENCOUNTER — Telehealth: Payer: Self-pay | Admitting: Pharmacist

## 2014-11-23 LAB — CMP14+EGFR
A/G RATIO: 1.5 (ref 1.1–2.5)
ALK PHOS: 81 IU/L (ref 39–117)
ALT: 16 IU/L (ref 0–32)
AST: 14 IU/L (ref 0–40)
Albumin: 4.3 g/dL (ref 3.5–5.5)
BUN / CREAT RATIO: 20 (ref 8–20)
BUN: 11 mg/dL (ref 6–20)
Bilirubin Total: 0.3 mg/dL (ref 0.0–1.2)
CO2: 22 mmol/L (ref 18–29)
Calcium: 10 mg/dL (ref 8.7–10.2)
Chloride: 95 mmol/L — ABNORMAL LOW (ref 97–108)
Creatinine, Ser: 0.55 mg/dL — ABNORMAL LOW (ref 0.57–1.00)
GFR, EST AFRICAN AMERICAN: 139 mL/min/{1.73_m2} (ref >59)
GFR, EST NON AFRICAN AMERICAN: 120 mL/min/{1.73_m2} (ref >59)
GLOBULIN, TOTAL: 2.9 g/dL (ref 1.5–4.5)
GLUCOSE: 216 mg/dL — AB (ref 65–99)
Potassium: 4.6 mmol/L (ref 3.5–5.2)
Sodium: 134 mmol/L (ref 134–144)
Total Protein: 7.2 g/dL (ref 6.0–8.5)

## 2014-11-23 LAB — MICROALBUMIN, URINE: Microalbumin, Urine: 846.4 ug/mL

## 2014-11-23 NOTE — Telephone Encounter (Signed)
Left patient a message about normal GAD and C Peptide.  She is to continue current medications but plan to introduce other oral medications in future to hopefully taper off and discontinue at least short acting insulin.

## 2014-12-06 ENCOUNTER — Ambulatory Visit (INDEPENDENT_AMBULATORY_CARE_PROVIDER_SITE_OTHER): Payer: Medicaid Other | Admitting: Family

## 2014-12-06 ENCOUNTER — Encounter: Payer: Self-pay | Admitting: Family

## 2014-12-06 VITALS — BP 130/89 | HR 95 | Temp 97.2°F | Ht 63.0 in | Wt 208.6 lb

## 2014-12-06 DIAGNOSIS — I10 Essential (primary) hypertension: Secondary | ICD-10-CM | POA: Diagnosis not present

## 2014-12-06 NOTE — Progress Notes (Signed)
   Subjective:    Patient ID: Kiara Mccall, female    DOB: 07/03/1975, 39 y.o.   MRN: [id number]  Pt presents to the office today for recheck of HTN. Pt's BP is at goal today. Hypertension This is a chronic problem. The current episode started more than 1 year ago. The problem has been resolved since onset. The problem is controlled. Associated symptoms include headaches. Pertinent negatives include no anxiety, palpitations, peripheral edema or shortness of breath. Risk factors for coronary artery disease include obesity, sedentary lifestyle, family history and dyslipidemia. Past treatments include ACE inhibitors, diuretics, beta blockers and calcium channel blockers. The current treatment provides significant improvement. There is no history of kidney disease, CAD/MI, CVA, heart failure or a thyroid problem.      Review of Systems  Constitutional: Negative.   Eyes: Negative.   Respiratory: Negative.  Negative for shortness of breath.   Cardiovascular: Negative.  Negative for palpitations.  Gastrointestinal: Negative.   Endocrine: Negative.   Genitourinary: Negative.   Musculoskeletal: Negative.   Neurological: Positive for headaches.  Hematological: Negative.   Psychiatric/Behavioral: Negative.   All other systems reviewed and are negative.      Objective:   Physical Exam  Constitutional: She is oriented to person, place, and time. She appears well-developed and well-nourished. No distress.  HENT:  Head: Normocephalic and atraumatic.  Right Ear: External ear normal.  Left Ear: External ear normal.  Nose: Nose normal.  Mouth/Throat: Oropharynx is clear and moist.  Eyes: Pupils are equal, round, and reactive to light.  Neck: Normal range of motion. Neck supple. No thyromegaly present.  Cardiovascular: Normal rate, regular rhythm, normal heart sounds and intact distal pulses.   No murmur heard. Pulmonary/Chest: Effort normal and breath sounds normal. No respiratory distress.  She has no wheezes.  Abdominal: Soft. Bowel sounds are normal. She exhibits no distension. There is no tenderness.  Musculoskeletal: Normal range of motion. She exhibits no edema or tenderness.  Neurological: She is alert and oriented to person, place, and time. She has normal reflexes. No cranial nerve deficit.  Skin: Skin is warm and dry.  Psychiatric: She has a normal mood and affect. Her behavior is normal. Judgment and thought content normal.  Vitals reviewed.   BP 130/89 mmHg  Pulse 95  Temp(Src) 97.2 F (36.2 C) (Oral)  Ht _0  (1.6 m)  Wt 208 lb 9.6 oz (94.62 kg)  BMI 36.96 kg/m2  LMP 10/01/2014       Assessment & Plan:  1. Essential hypertension --Daily blood pressure log given with instructions on how to fill out and told to bring to next visit -Dash diet information given -Exercise encouraged - Stress Management  -Continue current meds -RTO in 3 months - BMP8+EGFR  Evelina Dun, FNP

## 2014-12-06 NOTE — Patient Instructions (Signed)

## 2014-12-07 LAB — BMP8+EGFR
BUN/Creatinine Ratio: 22 — ABNORMAL HIGH (ref 8–20)
BUN: 11 mg/dL (ref 6–20)
CO2: 28 mmol/L (ref 18–29)
Calcium: 9.7 mg/dL (ref 8.7–10.2)
Chloride: 96 mmol/L — ABNORMAL LOW (ref 97–108)
Creatinine, Ser: 0.5 mg/dL — ABNORMAL LOW (ref 0.57–1.00)
GFR, EST AFRICAN AMERICAN: 143 mL/min/{1.73_m2} (ref >59)
GFR, EST NON AFRICAN AMERICAN: 124 mL/min/{1.73_m2} (ref >59)
Glucose: 233 mg/dL — ABNORMAL HIGH (ref 65–99)
Potassium: 4.2 mmol/L (ref 3.5–5.2)
SODIUM: 139 mmol/L (ref 134–144)

## 2014-12-15 ENCOUNTER — Telehealth: Payer: Self-pay | Admitting: Family

## 2014-12-15 DIAGNOSIS — E119 Type 2 diabetes mellitus without complications: Secondary | ICD-10-CM

## 2014-12-15 DIAGNOSIS — Z794 Long term (current) use of insulin: Principal | ICD-10-CM

## 2014-12-16 ENCOUNTER — Other Ambulatory Visit: Payer: Self-pay | Admitting: Family

## 2014-12-16 MED ORDER — INSULIN GLARGINE 100 UNIT/ML SOLOSTAR PEN: 20.0000 [IU] | PEN_INJECTOR | Freq: Every day | SUBCUTANEOUS | Status: DC

## 2014-12-16 MED ORDER — INSULIN LISPRO 100 UNIT/ML ~~LOC~~ SOLN: 10.0000 [IU] | Freq: Three times a day (TID) | SUBCUTANEOUS | Status: DC

## 2014-12-16 NOTE — Telephone Encounter (Signed)
Prescription sent to pharmacy.

## 2015-01-02 ENCOUNTER — Other Ambulatory Visit: Payer: Self-pay | Admitting: Family

## 2015-01-02 MED ORDER — LANCETS MICRO THIN 33G MISC: Status: DC

## 2015-01-02 NOTE — Telephone Encounter (Signed)
done

## 2015-01-05 ENCOUNTER — Encounter: Payer: Self-pay | Admitting: Pharmacist

## 2015-01-05 ENCOUNTER — Ambulatory Visit (INDEPENDENT_AMBULATORY_CARE_PROVIDER_SITE_OTHER): Payer: Medicaid Other | Admitting: Pharmacist

## 2015-01-05 VITALS — BP 128/80 | HR 88 | Ht 63.0 in | Wt 207.0 lb

## 2015-01-05 DIAGNOSIS — E119 Type 2 diabetes mellitus without complications: Secondary | ICD-10-CM | POA: Diagnosis not present

## 2015-01-05 DIAGNOSIS — Z794 Long term (current) use of insulin: Secondary | ICD-10-CM

## 2015-01-05 MED ORDER — PEN NEEDLES 32G X 4 MM MISC: 1.0000 | Freq: Four times a day (QID) | Status: DC

## 2015-01-05 MED ORDER — ALBIGLUTIDE 30 MG ~~LOC~~ PEN: 30.0000 mg | PEN_INJECTOR | SUBCUTANEOUS | Status: DC

## 2015-01-05 NOTE — Patient Instructions (Signed)
Diabetes and Standards of Medical Care   Diabetes is complicated. You may find that your diabetes team includes a dietitian, nurse, diabetes educator, eye doctor, and more. To help everyone know what is going on and to help you get the care you deserve, the following schedule of care was developed to help keep you on track. Below are the tests, exams, vaccines, medicines, education, and plans you will need.  Blood Glucose Goals Prior to meals = 80 - 130 Within 2 hours of the start of a meal = less than 180  HbA1c test (goal is less than 7.0% - your last value was10.7 %) This test shows how well you have controlled your glucose over the past 2 to 3 months. It is used to see if your diabetes management plan needs to be adjusted.   It is performed at least 2 times a year if you are meeting treatment goals.  It is performed 4 times a year if therapy has changed or if you are not meeting treatment goals.  Blood pressure test  This test is performed at every routine medical visit. The goal is less than 140/90 mmHg for most people, but 130/80 mmHg in some cases. Ask your health care provider about your goal.  Dental exam  Follow up with the dentist regularly.  Eye exam  If you are diagnosed with type 1 diabetes as a child, get an exam upon reaching the age of 80 years or older and have had diabetes for 3 to 5 years. Yearly eye exams are recommended after that initial eye exam.  If you are diagnosed with type 1 diabetes as an adult, get an exam within 5 years of diagnosis and then yearly.  If you are diagnosed with type 2 diabetes, get an exam as soon as possible after the diagnosis and then yearly.  Foot care exam  Visual foot exams are performed at every routine medical visit. The exams check for cuts, injuries, or other problems with the feet.  A comprehensive foot exam should be done yearly. This includes visual inspection as well as assessing foot pulses and testing for loss of  sensation.  Check your feet nightly for cuts, injuries, or other problems with your feet. Tell your health care provider if anything is not healing.  Kidney function test (urine microalbumin)  This test is performed once a year.  Type 1 diabetes: The first test is performed 5 years after diagnosis.  Type 2 diabetes: The first test is performed at the time of diagnosis.  A serum creatinine and estimated glomerular filtration rate (eGFR) test is done once a year to assess the level of chronic kidney disease (CKD), if present.  Lipid profile (cholesterol, HDL, LDL, triglycerides)  Performed every 5 years for most people.  The goal for LDL is less than 100 mg/dL. If you are at high risk, the goal is less than 70 mg/dL.  The goal for HDL is 40 mg/dL to 50 mg/dL for men and 50 mg/dL to 60 mg/dL for women. An HDL cholesterol of 60 mg/dL or higher gives some protection against heart disease.  The goal for triglycerides is less than 150 mg/dL.  Influenza vaccine, pneumococcal vaccine, and hepatitis B vaccine  The influenza vaccine is recommended yearly.  The pneumococcal vaccine is generally given once in a lifetime. However, there are some instances when another vaccination is recommended. Check with your health care provider.  The hepatitis B vaccine is also recommended for adults with diabetes.  Diabetes self-management education  Education is recommended at diagnosis and ongoing as needed.  Treatment plan  Your treatment plan is reviewed at every medical visit.  Document Released: 01/27/2009 Document Revised: 12/02/2012 Document Reviewed: 09/01/2012 ExitCare Patient Information 2014 [employer].   

## 2015-01-05 NOTE — Progress Notes (Signed)
Subjective:    Kiara Mccall is a 39 y.o. female who presents for follow up of diabetes mellitus.  She was initially diagnosed with diabetes 2 years ago.  It was unclear if she has type 2 or type 1 DM as she has always been prescribed insulin.  When she presented to our office 2 weeks ago she has been out of all medications for over 1 month.   C-Peptide from 11/19/2014 was 3.2 (WNL) and GAD antibody was less than 5.0 (WNL) Indicating she might have endogenous production of insulin.    Current symptoms/problems include :  None - all symptoms have improved.  Known diabetic complications: none Cardiovascular risk factors: diabetes mellitus, dyslipidemia, family history of premature cardiovascular disease, hypertension and obesity (BMI >= 30 kg/m2) Current diabetic medications include metformin  1 tablet bid and humalog 20 units tid prior to meals.  Patient had taken Lantus for a short period about 1 year ago..   Eye exam current (within one year): yes Weight trend: stable Current diet: patient has been eating more greens and vegetables. Less potatoes and bread.   Current exercise: none  Current monitoring regimen: home blood tests - three times daily Home blood sugar records: patient reports tange of 87 to 223 Any episodes of hypoglycemia? No reading less than 70   Is She on ACE inhibitor or angiotensin II receptor blocker?  Yes  lisinopril (Prinivil)    The following portions of the patient's history were reviewed and updated as appropriate: allergies, current medications, past family history, past medical history, past social history, past surgical history and problem list.   Objective:    BP 128/80 mmHg  Pulse 88  Ht  (1.6 m)  Wt 207 lb (93.895 kg)  BMI 36.68 kg/m2  Lab Review GLUCOSE (mg/dL)  Date Value  16/01/9603 233*  11/22/2014 216*  10/25/2014 341*   CO2 (mmol/L)  Date Value  12/06/2014 28  11/22/2014 22  10/25/2014 22   BUN (mg/dL)  Date Value   54/12/8117 11  11/22/2014 11  10/25/2014 9   CREATININE, SER (mg/dL)  Date Value  14/78/2956 0.50*  11/22/2014 0.55*  10/25/2014 0.52*   A1c = 10.7% (11/22/2014)   previous A1c was 13.3% (10/25/2014)   ssessment:    Diabetes Mellitus type II, under inadequate control.   HTN Hyperlipidemia  Plan:    1.  Rx changes as as follows:  Add Tanzeum  - inject  once weekly  Continue Lantus 20 units once daily and metformin  BID  Change Humalog to use just as needed for elevated BG;  If BG is 200 to 250 = 4 units; 251 to 300 = 5 units and if 301 or above = 6 units. 2.  Education: Reviewed 'ABCs' of diabetes management (respective goals in parentheses):  A1C (<7), blood pressure (<130/80), and cholesterol (LDL <100). 3.  Reviewed CHO counting diet.  Recommended CHO content of meals be 45 to 60 grams of CHO and for snack 15 grams of CHO or less.   4.  Reminded about pregnancy prevention.  As patient is still able to become pregnant I discussed with her that she is taking 2 medications - statin and ACE that are not recommended during pregnancy.  Discuss options for pregnancy prevention.  5.  Increase physical activity - goal of 30 to 40 minutes daily 4 to 5 days per week 7.  RTC in 1 month to follow up changes.  Henrene Pastor, PharmD, CPP, CDE

## 2015-01-09 ENCOUNTER — Ambulatory Visit: Payer: Medicaid Other

## 2015-01-10 ENCOUNTER — Ambulatory Visit (INDEPENDENT_AMBULATORY_CARE_PROVIDER_SITE_OTHER): Payer: Medicaid Other | Admitting: *Deleted

## 2015-01-10 DIAGNOSIS — Z111 Encounter for screening for respiratory tuberculosis: Secondary | ICD-10-CM | POA: Diagnosis not present

## 2015-01-10 NOTE — Patient Instructions (Signed)

## 2015-01-10 NOTE — Progress Notes (Signed)
Ppd skin test given and patient tolerated well. She will return on Thursday to have ppd read.

## 2015-01-12 ENCOUNTER — Ambulatory Visit: Payer: Medicaid Other

## 2015-01-13 ENCOUNTER — Encounter: Payer: Self-pay | Admitting: *Deleted

## 2015-01-13 LAB — TB SKIN TEST
Induration: 0 mm
TB Skin Test: NEGATIVE

## 2015-02-01 ENCOUNTER — Encounter: Payer: Self-pay | Admitting: Pharmacist

## 2015-02-01 ENCOUNTER — Ambulatory Visit (INDEPENDENT_AMBULATORY_CARE_PROVIDER_SITE_OTHER): Payer: Medicaid Other | Admitting: Pharmacist

## 2015-02-01 VITALS — BP 132/88 | HR 81 | Ht 63.0 in | Wt 208.5 lb

## 2015-02-01 DIAGNOSIS — E119 Type 2 diabetes mellitus without complications: Secondary | ICD-10-CM | POA: Diagnosis not present

## 2015-02-01 DIAGNOSIS — Z23 Encounter for immunization: Secondary | ICD-10-CM

## 2015-02-01 DIAGNOSIS — Z794 Long term (current) use of insulin: Secondary | ICD-10-CM

## 2015-02-01 MED ORDER — ALBIGLUTIDE 50 MG ~~LOC~~ PEN: 50.0000 mg | PEN_INJECTOR | SUBCUTANEOUS | Status: DC

## 2015-02-01 NOTE — Progress Notes (Signed)
Subjective:    Kiara Mccall is a 39 y.o. female who presents for follow up of diabetes mellitus.  She was initially diagnosed with diabetes 2 years ago.  It was unclear if she has type 2 or type 1 DM as she has always been prescribed insulin.  C-Peptide from 11/19/2014 was 3.2 (WNL) and GAD antibody was less than 5.0 (WNL) Indicating she might have endogenous production of insulin.   She started Tanzem  SQ q week about 2 weeks ago.  She reports that she is tolerating well and that BG has improved.  Current symptoms/problems include :  None - all symptoms have improved.  Known diabetic complications: none Cardiovascular risk factors: diabetes mellitus, dyslipidemia, family history of premature cardiovascular disease, hypertension and obesity (BMI >= 30 kg/m2) Current diabetic medications include metformin  1 tablet bid Lantus 20 units qd. Started Tanzem  about 2 weeks ago.  She also uses Humalog as needed for elevated BG over 200 but has not needed but once in that last 4 weeks  Eye exam current (within one year): yes Weight trend: stable Current diet: patient has been eating more greens and vegetables. Less potatoes and bread.  No soda - stopped about 1 month ago.   Current exercise: walking every day  Current monitoring regimen: home blood tests - two times daily Home blood sugar records: patient reports tange of 120 to 180  RBG in office = 193 today  Any episodes of hypoglycemia? No   Is She on ACE inhibitor or angiotensin II receptor blocker?  Yes  lisinopril (Prinivil)    The following portions of the patient's history were reviewed and updated as appropriate: allergies, current medications, past family history, past medical history, past social history, past surgical history and problem list.   Objective:    BP 132/88 mmHg  Pulse 81  Ht  (1.6 m)  Wt 208 lb 8 oz (94.575 kg)  BMI 36.94 kg/m2  Lab Review GLUCOSE (mg/dL)  Date Value  16/01/9603 233*   11/22/2014 216*  10/25/2014 341*   CO2 (mmol/L)  Date Value  12/06/2014 28  11/22/2014 22  10/25/2014 22   BUN (mg/dL)  Date Value  54/12/8117 11  11/22/2014 11  10/25/2014 9   CREATININE, SER (mg/dL)  Date Value  14/78/2956 0.50*  11/22/2014 0.55*  10/25/2014 0.52*   A1c = 10.7% (11/22/2014)   previous A1c was 13.3% (10/25/2014)   ssessment:    Diabetes Mellitus type II, under inadequate control but improving with recent medication changes.   HTN Hyperlipidemia  Plan:    1.  Rx changes as as follows:  Increase Tanzeum to  - inject  once weekly  Continue Lantus 20 units once daily and metformin  BID  Continue Humalog to use just as needed for elevated BG;  If BG is 200 to 250 = 4 units; 251 to 300 = 5 units and if 301 or above = 6 units. 2.  Education: Reviewed 'ABCs' of diabetes management (respective goals in parentheses):  A1C (<7), blood pressure (<130/80), and cholesterol (LDL <100). 3.  Reviewed CHO counting diet.  Recommended CHO content of meals be 45 to 60 grams of CHO and for snack 15 grams of CHO or less. Patient is doing a good job with dietary changes  4.  Influenza vaccine given in office today 5.  Increase physical activity - goal of 30 to 40 minutes daily 4 to 5 days per week 7.  RTC in 1 month to follow  up changes.  Henrene Pastorammy Bethenny Losee, PharmD, CPP, CDE

## 2015-02-01 NOTE — Patient Instructions (Signed)
Diabetes and Standards of Medical Care   Diabetes is complicated. You may find that your diabetes team includes a dietitian, nurse, diabetes educator, eye doctor, and more. To help everyone know what is going on and to help you get the care you deserve, the following schedule of care was developed to help keep you on track. Below are the tests, exams, vaccines, medicines, education, and plans you will need.  Blood Glucose Goals Prior to meals = 80 - 130 Within 2 hours of the start of a meal = less than 180  HbA1c test (goal is less than 7.0% - your last value was 10.7% - 11/2014;  This was improved from 13.3% in July 2016) This test shows how well you have controlled your glucose over the past 2 to 3 months. It is used to see if your diabetes management plan needs to be adjusted.   It is performed at least 2 times a year if you are meeting treatment goals.  It is performed 4 times a year if therapy has changed or if you are not meeting treatment goals.  Blood pressure test  This test is performed at every routine medical visit. The goal is less than 140/90 mmHg for most people, but 130/80 mmHg in some cases. Ask your health care provider about your goal.  Dental exam  Follow up with the dentist regularly.  Eye exam  If you are diagnosed with type 1 diabetes as a child, get an exam upon reaching the age of 40 years or older and have had diabetes for 3 to 5 years. Yearly eye exams are recommended after that initial eye exam.  If you are diagnosed with type 1 diabetes as an adult, get an exam within 5 years of diagnosis and then yearly.  If you are diagnosed with type 2 diabetes, get an exam as soon as possible after the diagnosis and then yearly.  Foot care exam  Visual foot exams are performed at every routine medical visit. The exams check for cuts, injuries, or other problems with the feet.  A comprehensive foot exam should be done yearly. This includes visual inspection as well  as assessing foot pulses and testing for loss of sensation.  Check your feet nightly for cuts, injuries, or other problems with your feet. Tell your health care provider if anything is not healing.  Kidney function test (urine microalbumin)  This test is performed once a year.  Type 1 diabetes: The first test is performed 5 years after diagnosis.  Type 2 diabetes: The first test is performed at the time of diagnosis.  A serum creatinine and estimated glomerular filtration rate (eGFR) test is done once a year to assess the level of chronic kidney disease (CKD), if present.  Lipid profile (cholesterol, HDL, LDL, triglycerides)  Performed every 5 years for most people.  The goal for LDL is less than 100 mg/dL. If you are at high risk, the goal is less than 70 mg/dL.  The goal for HDL is 40 mg/dL to 50 mg/dL for men and 50 mg/dL to 60 mg/dL for women. An HDL cholesterol of 60 mg/dL or higher gives some protection against heart disease.  The goal for triglycerides is less than 150 mg/dL.  Influenza vaccine, pneumococcal vaccine, and hepatitis B vaccine  The influenza vaccine is recommended yearly.  The pneumococcal vaccine is generally given once in a lifetime. However, there are some instances when another vaccination is recommended. Check with your health care provider.  The  hepatitis B vaccine is also recommended for adults with diabetes.  Diabetes self-management education  Education is recommended at diagnosis and ongoing as needed.  Treatment plan  Your treatment plan is reviewed at every medical visit.  Document Released: 01/27/2009 Document Revised: 12/02/2012 Document Reviewed: 09/01/2012 [facility] Patient Information 2014 [place].

## 2015-03-13 ENCOUNTER — Encounter: Payer: Self-pay | Admitting: Family

## 2015-03-13 ENCOUNTER — Ambulatory Visit (INDEPENDENT_AMBULATORY_CARE_PROVIDER_SITE_OTHER): Payer: Medicaid Other | Admitting: Family

## 2015-03-13 VITALS — BP 114/80 | HR 86 | Temp 97.1°F | Ht 63.0 in | Wt 206.6 lb

## 2015-03-13 DIAGNOSIS — E669 Obesity, unspecified: Secondary | ICD-10-CM

## 2015-03-13 DIAGNOSIS — Z794 Long term (current) use of insulin: Secondary | ICD-10-CM

## 2015-03-13 DIAGNOSIS — E119 Type 2 diabetes mellitus without complications: Secondary | ICD-10-CM | POA: Diagnosis not present

## 2015-03-13 DIAGNOSIS — E785 Hyperlipidemia, unspecified: Secondary | ICD-10-CM

## 2015-03-13 DIAGNOSIS — W57XXXA Bitten or stung by nonvenomous insect and other nonvenomous arthropods, initial encounter: Secondary | ICD-10-CM

## 2015-03-13 DIAGNOSIS — S20469A Insect bite (nonvenomous) of unspecified back wall of thorax, initial encounter: Secondary | ICD-10-CM | POA: Diagnosis not present

## 2015-03-13 DIAGNOSIS — F418 Other specified anxiety disorders: Secondary | ICD-10-CM

## 2015-03-13 DIAGNOSIS — I10 Essential (primary) hypertension: Secondary | ICD-10-CM | POA: Diagnosis not present

## 2015-03-13 DIAGNOSIS — F32A Depression, unspecified: Secondary | ICD-10-CM

## 2015-03-13 DIAGNOSIS — F329 Major depressive disorder, single episode, unspecified: Secondary | ICD-10-CM

## 2015-03-13 DIAGNOSIS — F419 Anxiety disorder, unspecified: Secondary | ICD-10-CM

## 2015-03-13 LAB — POCT GLYCOSYLATED HEMOGLOBIN (HGB A1C): HEMOGLOBIN A1C: 7.8

## 2015-03-13 MED ORDER — HYDROXYZINE PAMOATE 25 MG PO CAPS: 25.0000 mg | ORAL_CAPSULE | Freq: Three times a day (TID) | ORAL | Status: DC | PRN

## 2015-03-13 NOTE — Progress Notes (Signed)
Subjective:    Patient ID: Kiara Mccall, female    DOB: Jul 05, 1975, 39 y.o.   MRN: [id number]  Pt presents to the office today for chronic follow up.  Diabetes She presents for her follow-up diabetic visit. She has type 2 diabetes mellitus. Her disease course has been fluctuating. Hypoglycemia symptoms include nervousness/anxiousness. Pertinent negatives for hypoglycemia include no confusion or headaches. Pertinent negatives for diabetes include no blurred vision, no fatigue, no foot paresthesias, no foot ulcerations and no visual change. There are no hypoglycemic complications. Symptoms are stable. Pertinent negatives for diabetic complications include no CVA, heart disease, nephropathy or peripheral neuropathy. Current diabetic treatment includes insulin injections and oral agent (monotherapy). She is compliant with treatment all of the time. She is following a generally healthy diet. She rarely participates in exercise. Her breakfast blood glucose range is generally 140-180 mg/dl. An ACE inhibitor/angiotensin II receptor blocker is being taken. Eye exam is current.  Hypertension This is a chronic problem. The current episode started more than 1 year ago. The problem has been resolved since onset. The problem is controlled. Pertinent negatives include no anxiety, blurred vision, headaches, palpitations, peripheral edema or shortness of breath. Risk factors for coronary artery disease include dyslipidemia, obesity, post-menopausal state, sedentary lifestyle and family history. Past treatments include ACE inhibitors and beta blockers. There is no history of kidney disease, CAD/MI, CVA or heart failure.  Hyperlipidemia This is a chronic problem. The current episode started more than 1 year ago. The problem is uncontrolled. Recent lipid tests were reviewed and are high. Pertinent negatives include no shortness of breath. Current antihyperlipidemic treatment includes statins. The current treatment  provides mild improvement of lipids. Risk factors for coronary artery disease include dyslipidemia, diabetes mellitus, family history, hypertension, obesity and post-menopausal.  Depression      The patient presents with depression.  This is a chronic problem.  The current episode started more than 1 year ago.   The onset quality is gradual.   The problem occurs intermittently.  The problem has been resolved since onset.  Associated symptoms include no fatigue, does not have insomnia, not irritable, no restlessness and no headaches.  Past treatments include SSRIs - Selective serotonin reuptake inhibitors.  Past medical history includes depression.     Pertinent negatives include no anxiety. Anxiety Presents for follow-up visit. Onset was more than 5 years ago. The problem has been resolved. Symptoms include depressed mood, excessive worry and nervous/anxious behavior. Patient reports no confusion, insomnia, malaise, palpitations, restlessness or shortness of breath. Symptoms occur occasionally. The symptoms are aggravated by family issues. The quality of sleep is good.   Her past medical history is significant for anxiety/panic attacks and depression. Past treatments include SSRIs. The treatment provided moderate relief. Compliance with prior treatments has been good.      Review of Systems  Constitutional: Negative.  Negative for fatigue.  HENT: Negative.   Eyes: Negative.  Negative for blurred vision.  Respiratory: Negative.  Negative for shortness of breath.   Cardiovascular: Negative.  Negative for palpitations.  Gastrointestinal: Negative.   Endocrine: Negative.   Genitourinary: Negative.   Musculoskeletal: Negative.   Neurological: Negative.  Negative for headaches.  Hematological: Negative.   Psychiatric/Behavioral: Positive for depression. Negative for confusion. The patient is nervous/anxious. The patient does not have insomnia.   All other systems reviewed and are negative.        Objective:   Physical Exam  Constitutional: She is oriented to person, place, and time. She appears  well-developed and well-nourished. She is not irritable. No distress.  HENT:  Head: Normocephalic and atraumatic.  Right Ear: External ear normal.  Left Ear: External ear normal.  Nose: Nose normal.  Mouth/Throat: Oropharynx is clear and moist.  Eyes: Pupils are equal, round, and reactive to light.  Neck: Normal range of motion. Neck supple. No thyromegaly present.  Cardiovascular: Normal rate, regular rhythm, normal heart sounds and intact distal pulses.   No murmur heard. Pulmonary/Chest: Effort normal and breath sounds normal. No respiratory distress. She has no wheezes.  Abdominal: Soft. Bowel sounds are normal. She exhibits no distension. There is no tenderness.  Musculoskeletal: Normal range of motion. She exhibits no edema or tenderness.  Neurological: She is alert and oriented to person, place, and time. She has normal reflexes. No cranial nerve deficit.  Skin: Skin is warm and dry.  Psychiatric: She has a normal mood and affect. Her behavior is normal. Judgment and thought content normal.  Vitals reviewed.    BP 114/80 mmHg  Pulse 86  Temp(Src) 97.1 F (36.2 C)  Ht _0  (1.6 m)  Wt 206 lb 9.6 oz (93.713 kg)  BMI 36.61 kg/m2      Assessment & Plan:  1. Essential hypertension - CMP14+EGFR  2. Diabetes mellitus type 2, insulin dependent (HCC) - POCT glycosylated hemoglobin (Hb A1C) - CMP14+EGFR  3. Anxiety and depression - CMP14+EGFR  4. Hyperlipidemia - CMP14+EGFR - Lipid panel  5. Obesity - CMP14+EGFR  6. Bed bug bite -Discussed pt may need to get a new mattress  - hydrOXYzine (VISTARIL) 25 MG capsule; Take 1 capsule (25 mg total) by mouth every 8 (eight) hours as needed.  Dispense: 60 capsule; Refill: 0   Continue all meds Labs pending Health Maintenance reviewed Diet and exercise encouraged RTO 3 months  Evelina Dun, FNP

## 2015-03-13 NOTE — Patient Instructions (Addendum)
Health Maintenance, Female Adopting a healthy lifestyle and getting preventive care can go a long way to promote health and wellness. Talk with your health care provider about what schedule of regular examinations is right for you. This is a good chance for you to check in with your provider about disease prevention and staying healthy. In between checkups, there are plenty of things you can do on your own. Experts have done a lot of research about which lifestyle changes and preventive measures are most likely to keep you healthy. Ask your health care provider for more information. WEIGHT AND DIET  Eat a healthy diet  Be sure to include plenty of vegetables, fruits, low-fat dairy products, and lean protein.  Do not eat a lot of foods high in solid fats, added sugars, or salt.  Get regular exercise. This is one of the most important things you can do for your health.  Most adults should exercise for at least 150 minutes each week. The exercise should increase your heart rate and make you sweat (moderate-intensity exercise).  Most adults should also do strengthening exercises at least twice a week. This is in addition to the moderate-intensity exercise.  Maintain a healthy weight  Body mass index (BMI) is a measurement that can be used to identify possible weight problems. It estimates body fat based on height and weight. Your health care provider can help determine your BMI and help you achieve or maintain a healthy weight.  For females 20 years of age and older:   A BMI below 18.5 is considered underweight.  A BMI of 18.5 to 24.9 is normal.  A BMI of 25 to 29.9 is considered overweight.  A BMI of 30 and above is considered obese.  Watch levels of cholesterol and blood lipids  You should start having your blood tested for lipids and cholesterol at 38 years of age, then have this test every 5 years.  You may need to have your cholesterol levels checked more often if:  Your lipid  or cholesterol levels are high.  You are older than 38 years of age.  You are at high risk for heart disease.  CANCER SCREENING   Lung Cancer  Lung cancer screening is recommended for adults 55-80 years old who are at high risk for lung cancer because of a history of smoking.  A yearly low-dose CT scan of the lungs is recommended for people who:  Currently smoke.  Have quit within the past 15 years.  Have at least a 30-pack-year history of smoking. A pack year is smoking an average of one pack of cigarettes a day for 1 year.  Yearly screening should continue until it has been 15 years since you quit.  Yearly screening should stop if you develop a health problem that would prevent you from having lung cancer treatment.  Breast Cancer  Practice breast self-awareness. This means understanding how your breasts normally appear and feel.  It also means doing regular breast self-exams. Let your health care provider know about any changes, no matter how small.  If you are in your 20s or 30s, you should have a clinical breast exam (CBE) by a health care provider every 1-3 years as part of a regular health exam.  If you are 40 or older, have a CBE every year. Also consider having a breast X-ray (mammogram) every year.  If you have a family history of breast cancer, talk to your health care provider about genetic screening.  If you   are at high risk for breast cancer, talk to your health care provider about having an MRI and a mammogram every year.  Breast cancer gene (BRCA) assessment is recommended for women who have family members with BRCA-related cancers. BRCA-related cancers include:  Breast.  Ovarian.  Tubal.  Peritoneal cancers.  Results of the assessment will determine the need for genetic counseling and BRCA1 and BRCA2 testing. Cervical Cancer Your health care provider may recommend that you be screened regularly for cancer of the pelvic organs (ovaries, uterus, and  vagina). This screening involves a pelvic examination, including checking for microscopic changes to the surface of your cervix (Pap test). You may be encouraged to have this screening done every 3 years, beginning at age 21.  For women ages 30-65, health care providers may recommend pelvic exams and Pap testing every 3 years, or they may recommend the Pap and pelvic exam, combined with testing for human papilloma virus (HPV), every 5 years. Some types of HPV increase your risk of cervical cancer. Testing for HPV may also be done on women of any age with unclear Pap test results.  Other health care providers may not recommend any screening for nonpregnant women who are considered low risk for pelvic cancer and who do not have symptoms. Ask your health care provider if a screening pelvic exam is right for you.  If you have had past treatment for cervical cancer or a condition that could lead to cancer, you need Pap tests and screening for cancer for at least 20 years after your treatment. If Pap tests have been discontinued, your risk factors (such as having a new sexual partner) need to be reassessed to determine if screening should resume. Some women have medical problems that increase the chance of getting cervical cancer. In these cases, your health care provider may recommend more frequent screening and Pap tests. Colorectal Cancer  This type of cancer can be detected and often prevented.  Routine colorectal cancer screening usually begins at 38 years of age and continues through 38 years of age.  Your health care provider may recommend screening at an earlier age if you have risk factors for colon cancer.  Your health care provider may also recommend using home test kits to check for hidden blood in the stool.  A small camera at the end of a tube can be used to examine your colon directly (sigmoidoscopy or colonoscopy). This is done to check for the earliest forms of colorectal  cancer.  Routine screening usually begins at age 50.  Direct examination of the colon should be repeated every 5-10 years through 38 years of age. However, you may need to be screened more often if early forms of precancerous polyps or small growths are found. Skin Cancer  Check your skin from head to toe regularly.  Tell your health care provider about any new moles or changes in moles, especially if there is a change in a mole's shape or color.  Also tell your health care provider if you have a mole that is larger than the size of a pencil eraser.  Always use sunscreen. Apply sunscreen liberally and repeatedly throughout the day.  Protect yourself by wearing long sleeves, pants, a wide-brimmed hat, and sunglasses whenever you are outside. HEART DISEASE, DIABETES, AND HIGH BLOOD PRESSURE   High blood pressure causes heart disease and increases the risk of stroke. High blood pressure is more likely to develop in:  People who have blood pressure in the high end   of the normal range (130-139/85-89 mm Hg).  People who are overweight or obese.  People who are African American.  If you are 38-23 years of age, have your blood pressure checked every 3-5 years. If you are 61 years of age or older, have your blood pressure checked every year. You should have your blood pressure measured twice--once when you are at a hospital or clinic, and once when you are not at a hospital or clinic. Record the average of the two measurements. To check your blood pressure when you are not at a hospital or clinic, you can use:  An automated blood pressure machine at a pharmacy.  A home blood pressure monitor.  If you are between 45 years and 39 years old, ask your health care provider if you should take aspirin to prevent strokes.  Have regular diabetes screenings. This involves taking a blood sample to check your fasting blood sugar level.  If you are at a normal weight and have a low risk for diabetes,  have this test once every three years after 38 years of age.  If you are overweight and have a high risk for diabetes, consider being tested at a younger age or more often. PREVENTING INFECTION  Hepatitis B  If you have a higher risk for hepatitis B, you should be screened for this virus. You are considered at high risk for hepatitis B if:  You were born in a country where hepatitis B is common. Ask your health care provider which countries are considered high risk.  Your parents were born in a high-risk country, and you have not been immunized against hepatitis B (hepatitis B vaccine).  You have HIV or AIDS.  You use needles to inject street drugs.  You live with someone who has hepatitis B.  You have had sex with someone who has hepatitis B.  You get hemodialysis treatment.  You take certain medicines for conditions, including cancer, organ transplantation, and autoimmune conditions. Hepatitis C  Blood testing is recommended for:  Everyone born from [birth year] through 1964.  Anyone with known risk factors for hepatitis C. Sexually transmitted infections (STIs)  You should be screened for sexually transmitted infections (STIs) including gonorrhea and chlamydia if:  You are sexually active and are younger than 38 years of age.  You are older than 38 years of age and your health care provider tells you that you are at risk for this type of infection.  Your sexual activity has changed since you were last screened and you are at an increased risk for chlamydia or gonorrhea. Ask your health care provider if you are at risk.  If you do not have HIV, but are at risk, it may be recommended that you take a prescription medicine daily to prevent HIV infection. This is called pre-exposure prophylaxis (PrEP). You are considered at risk if:  You are sexually active and do not regularly use condoms or know the HIV status of your partner(s).  You take drugs by injection.  You are sexually  active with a partner who has HIV. Talk with your health care provider about whether you are at high risk of being infected with HIV. If you choose to begin PrEP, you should first be tested for HIV. You should then be tested every 3 months for as long as you are taking PrEP.  PREGNANCY   If you are premenopausal and you may become pregnant, ask your health care provider about preconception counseling.  If you may  become pregnant, take 400 to 800 micrograms (mcg) of folic acid every day.  If you want to prevent pregnancy, talk to your health care provider about birth control (contraception). OSTEOPOROSIS AND MENOPAUSE   Osteoporosis is a disease in which the bones lose minerals and strength with aging. This can result in serious bone fractures. Your risk for osteoporosis can be identified using a bone density scan.  If you are 25 years of age or older, or if you are at risk for osteoporosis and fractures, ask your health care provider if you should be screened.  Ask your health care provider whether you should take a calcium or vitamin D supplement to lower your risk for osteoporosis.  Menopause may have certain physical symptoms and risks.  Hormone replacement therapy may reduce some of these symptoms and risks. Talk to your health care provider about whether hormone replacement therapy is right for you.  HOME CARE INSTRUCTIONS   Schedule regular health, dental, and eye exams.  Stay current with your immunizations.   Do not use any tobacco products including cigarettes, chewing tobacco, or electronic cigarettes.  If you are pregnant, do not drink alcohol.  If you are breastfeeding, limit how much and how often you drink alcohol.  Limit alcohol intake to no more than 1 drink per day for nonpregnant women. One drink equals 12 ounces of beer, 5 ounces of wine, or 1 ounces of hard liquor.  Do not use street drugs.  Do not share needles.  Ask your health care provider for help if  you need support or information about quitting drugs.  Tell your health care provider if you often feel depressed.  Tell your health care provider if you have ever been abused or do not feel safe at home.   This information is not intended to replace advice given to you by your health care provider. Make sure you discuss any questions you have with your health care provider.   Document Released: 10/15/2010 Document Revised: 04/22/2014 Document Reviewed: 03/03/2013 Elsevier Interactive Patient Education 2016 [place] are tiny bugs that live in and around beds. They stay hidden during the day, and they come out at night and bite. Bedbugs need blood to live and grow. WHERE ARE BEDBUGS FOUND? Bedbugs can be found anywhere, whether a place is clean or dirty. They are most often found in places where many people come and go, such as hotels, shelters, dorms, and health care settings. It is also common for them to be found in homes where there are many birds or bats nearby. WHAT ARE [place]? A bedbug bite leaves a small red bump with a darker red dot in the middle. The bump may appear soon after a person is bitten or a day or more later. Bedbug bites usually do not hurt, but they may itch. Most people do not need treatment for bedbug bites. The bumps usually go away on their own in a few days. HOW DO I CHECK FOR BEDBUGS? Bedbugs are reddish-brown, oval, and flat. They range in size from 1 mm to 7 mm and they cannot fly. Look for bedbugs in these places:  On mattresses, bed frames, headboards, and box springs.  On drapes and curtains in bedrooms.  Under carpeting in bedrooms.  Behind electrical outlets.  Behind any wallpaper that is peeling.  Inside luggage. Also look for black or red spots or stains on or near the bed. Stains can come from bedbugs that have been crushed  or from bedbug waste. WHAT SHOULD I DO IF I FIND BEDBUGS? When Traveling If you find  bedbugs while traveling, check all of your possessions carefully before you bring them into your home. Consider throwing away anything that has bedbugs on it. At Home If you find bedbugs at home, your bedroom may need to be treated by a pest control expert. You may also need to throw away mattresses or luggage. To help keep bedbugs from coming back, consider taking these actions:  Put a plastic cover over your mattress.  Wash your clothes and bedding in water that is hotter than 120F (48.9C) and dry them on a hot setting. Bedbugs are killed by high temperatures.  Vacuum often around the bed and in all of the cracks and crevices where the bugs might hide.  Carefully check all used furniture, bedding, or clothes that you bring into your home.  Eliminate bird nests and bat roosts that are near your home. In Your Bed If you find bedbugs in your bed, consider wearing pajamas that have long sleeves and pant legs. Bedbugs usually bite areas of the skin that are not covered.   This information is not intended to replace advice given to you by your health care provider. Make sure you discuss any questions you have with your health care provider.   Document Released: 05/04/2010 Document Revised: 08/16/2014 Document Reviewed: 03/28/2014 Elsevier Interactive Patient Education [employer].

## 2015-03-14 ENCOUNTER — Other Ambulatory Visit: Payer: Self-pay | Admitting: Family

## 2015-03-14 LAB — CMP14+EGFR
ALT: 11 IU/L (ref 0–32)
AST: 11 IU/L (ref 0–40)
Albumin/Globulin Ratio: 1.4 (ref 1.1–2.5)
Albumin: 4 g/dL (ref 3.5–5.5)
Alkaline Phosphatase: 62 IU/L (ref 39–117)
BUN/Creatinine Ratio: 34 — ABNORMAL HIGH (ref 8–20)
BUN: 16 mg/dL (ref 6–20)
Bilirubin Total: 0.2 mg/dL (ref 0.0–1.2)
CALCIUM: 9.5 mg/dL (ref 8.7–10.2)
CHLORIDE: 95 mmol/L — AB (ref 97–106)
CO2: 21 mmol/L (ref 18–29)
Creatinine, Ser: 0.47 mg/dL — ABNORMAL LOW (ref 0.57–1.00)
GFR, EST AFRICAN AMERICAN: 146 mL/min/{1.73_m2} (ref >59)
GFR, EST NON AFRICAN AMERICAN: 127 mL/min/{1.73_m2} (ref >59)
GLUCOSE: 181 mg/dL — AB (ref 65–99)
Globulin, Total: 2.9 g/dL (ref 1.5–4.5)
Potassium: 4.2 mmol/L (ref 3.5–5.2)
Sodium: 135 mmol/L — ABNORMAL LOW (ref 136–144)
TOTAL PROTEIN: 6.9 g/dL (ref 6.0–8.5)

## 2015-03-14 LAB — LIPID PANEL
CHOL/HDL RATIO: 4.6 ratio — AB (ref 0.0–4.4)
Cholesterol, Total: 212 mg/dL — ABNORMAL HIGH (ref 100–199)
HDL: 46 mg/dL (ref >39)
LDL Calculated: 131 mg/dL — ABNORMAL HIGH (ref 0–99)
TRIGLYCERIDES: 174 mg/dL — AB (ref 0–149)
VLDL CHOLESTEROL CAL: 35 mg/dL (ref 5–40)

## 2015-04-13 ENCOUNTER — Encounter: Payer: Self-pay | Admitting: *Deleted

## 2015-04-19 ENCOUNTER — Other Ambulatory Visit: Payer: Self-pay | Admitting: Physician Assistant

## 2015-04-19 ENCOUNTER — Other Ambulatory Visit: Payer: Self-pay | Admitting: Pharmacist

## 2015-04-25 ENCOUNTER — Other Ambulatory Visit: Payer: Self-pay

## 2015-04-25 MED ORDER — PEN NEEDLES 32G X 4 MM MISC: 1.0000 | Freq: Four times a day (QID) | Status: DC

## 2015-05-04 ENCOUNTER — Ambulatory Visit: Payer: Self-pay | Admitting: Pharmacist

## 2015-05-08 ENCOUNTER — Ambulatory Visit (INDEPENDENT_AMBULATORY_CARE_PROVIDER_SITE_OTHER): Payer: Medicaid Other | Admitting: Pharmacist

## 2015-05-08 ENCOUNTER — Encounter: Payer: Self-pay | Admitting: Pharmacist

## 2015-05-08 VITALS — BP 148/90 | HR 80 | Ht 63.0 in | Wt 209.0 lb

## 2015-05-08 DIAGNOSIS — I10 Essential (primary) hypertension: Secondary | ICD-10-CM | POA: Diagnosis not present

## 2015-05-08 DIAGNOSIS — E119 Type 2 diabetes mellitus without complications: Secondary | ICD-10-CM | POA: Diagnosis not present

## 2015-05-08 DIAGNOSIS — Z794 Long term (current) use of insulin: Secondary | ICD-10-CM

## 2015-05-08 MED ORDER — ALBIGLUTIDE 50 MG ~~LOC~~ PEN: 50.0000 mg | PEN_INJECTOR | SUBCUTANEOUS | Status: DC

## 2015-05-08 NOTE — Progress Notes (Signed)
Subjective:    Kiara Mccall is a 40 y.o. female who presents for follow up of type 2 diabetes mellitus.   She was initially diagnosed with diabetes 2 years ago.  It was unclear if she has type 2 or type 1 DM as she has always been prescribed insulin.  C-Peptide from 11/19/2014 was 3.2 (WNL) and GAD antibody was less than 5.0 (WNL) Indicating she might have endogenous production of insulin.    Current symptoms/problems include :  None - all symptoms have improved.  Known diabetic complications: none Cardiovascular risk factors: diabetes mellitus, dyslipidemia, family history of premature cardiovascular disease, hypertension and obesity (BMI >= 30 kg/m2)   Current diabetic medications include metformin  1 tablet bid, Lantus 20 units qd, Tanzem  q week -(but ran out because pharmacy told her she did not have refills and has not taken in about 1 month) She also uses Humalog as needed for elevated BG over 200 but has not needed but in the last 3 months  Eye exam current (within one year): yes Weight trend: stable Current diet: patient has been eating more greens and vegetables. Less potatoes and bread.  No soda - stopped about 3 months ago.   Current exercise: walking every day  Current monitoring regimen: home blood tests - two times daily Home blood sugar records: patient reports tange of 127 to 268  Any episodes of hypoglycemia? No   Is She on ACE inhibitor or angiotensin II receptor blocker?  Yes  lisinopril (Prinivil)    The following portions of the patient's history were reviewed and updated as appropriate: allergies, current medications, past family history, past medical history, past social history, past surgical history and problem list.   Objective:    BP 148/90 mmHg  Pulse 80  Ht  (1.6 m)  Wt 209 lb (94.802 kg)  BMI 37.03 kg/m2  Lab Review GLUCOSE (mg/dL)  Date Value  16/01/9603 181*  12/06/2014 233*  11/22/2014 216*   CO2 (mmol/L)  Date Value   03/13/2015 21  12/06/2014 28  11/22/2014 22   BUN (mg/dL)  Date Value  54/12/8117 16  12/06/2014 11  11/22/2014 11   CREATININE, SER (mg/dL)  Date Value  14/78/2956 0.47*  12/06/2014 0.50*  11/22/2014 0.55*    Date A1c  03/13/2015 7.8%  11/22/2014 10.7%  10/25/2014 13.3%    ssessment:    Diabetes Mellitus type II, under improving control but not at goals yet.   HTN - elevated today but was at goal when last checked. Hyperlipidemia - due to recheck in 1-2 months.  Patient is compliant with atorvastatin now  Plan:    1.  Rx changes as as follows:  Restart Tanzeum  - inject once weekly  Continue Lantus 20 units once daily and metformin  BID  Continue Humalog to use just as needed for elevated BG;  If BG is 200 to 250 = 4 units; 251 to 300 = 5 units and if 301 or above = 6 units. 2.  Education: Reviewed 'ABCs' of diabetes management (respective goals in parentheses):  A1C (<7), blood pressure (<130/80), and cholesterol (LDL <100). 3.  Reviewed CHO counting diet.  Recommended CHO content of meals be 45 to 60 grams of CHO and for snack 15 grams of CHO or less. Patient is doing a good job with dietary changes  4.  Increase physical activity - goal of 30 to 40 minutes daily 4 to 5 days per week 5. Continue with current BP meds -  follow up with PCP in 1 month to recheck BP   Henrene Pastor, PharmD, CPP, CDE

## 2015-05-08 NOTE — Patient Instructions (Signed)
Diabetes and Standards of Medical Care   Diabetes is complicated. You may find that your diabetes team includes a dietitian, nurse, diabetes educator, eye doctor, and more. To help everyone know what is going on and to help you get the care you deserve, the following schedule of care was developed to help keep you on track. Below are the tests, exams, vaccines, medicines, education, and plans you will need.  Blood Glucose Goals Prior to meals = 80 - 130 Within 2 hours of the start of a meal = less than 180  HbA1c test (goal is less than 7.0% - your last value was 7.8%) This test shows how well you have controlled your glucose over the past 2 to 3 months. It is used to see if your diabetes management plan needs to be adjusted.   It is performed at least 2 times a year if you are meeting treatment goals.  It is performed 4 times a year if therapy has changed or if you are not meeting treatment goals.  Blood pressure test  This test is performed at every routine medical visit. The goal is less than 140/90 mmHg for most people, but 130/80 mmHg in some cases. Ask your health care provider about your goal.  Dental exam  Follow up with the dentist regularly.  Eye exam  If you are diagnosed with type 1 diabetes as a child, get an exam upon reaching the age of 10 years or older and have had diabetes for 3 to 5 years. Yearly eye exams are recommended after that initial eye exam.  If you are diagnosed with type 1 diabetes as an adult, get an exam within 5 years of diagnosis and then yearly.  If you are diagnosed with type 2 diabetes, get an exam as soon as possible after the diagnosis and then yearly.  Foot care exam  Visual foot exams are performed at every routine medical visit. The exams check for cuts, injuries, or other problems with the feet.  A comprehensive foot exam should be done yearly. This includes visual inspection as well as assessing foot pulses and testing for loss of  sensation.  Check your feet nightly for cuts, injuries, or other problems with your feet. Tell your health care provider if anything is not healing.  Kidney function test (urine microalbumin)  This test is performed once a year.  Type 1 diabetes: The first test is performed 5 years after diagnosis.  Type 2 diabetes: The first test is performed at the time of diagnosis.  A serum creatinine and estimated glomerular filtration rate (eGFR) test is done once a year to assess the level of chronic kidney disease (CKD), if present.  Lipid profile (cholesterol, HDL, LDL, triglycerides)  Performed every 5 years for most people.  The goal for LDL is less than 100 mg/dL. If you are at high risk, the goal is less than 70 mg/dL.  The goal for HDL is 40 mg/dL to 50 mg/dL for men and 50 mg/dL to 60 mg/dL for women. An HDL cholesterol of 60 mg/dL or higher gives some protection against heart disease.  The goal for triglycerides is less than 150 mg/dL.  Influenza vaccine, pneumococcal vaccine, and hepatitis B vaccine  The influenza vaccine is recommended yearly.  The pneumococcal vaccine is generally given once in a lifetime. However, there are some instances when another vaccination is recommended. Check with your health care provider.  The hepatitis B vaccine is also recommended for adults with diabetes.    Diabetes self-management education  Education is recommended at diagnosis and ongoing as needed.  Treatment plan  Your treatment plan is reviewed at every medical visit.  Document Released: 01/27/2009 Document Revised: 12/02/2012 Document Reviewed: 09/01/2012 [employer] Patient Information 2014 [employer].   

## 2015-05-09 LAB — MICROALBUMIN / CREATININE URINE RATIO
CREATININE, UR: 187.7 mg/dL
MICROALB/CREAT RATIO: 122.2 mg/g{creat} — AB (ref 0.0–30.0)
Microalbumin, Urine: 229.4 ug/mL

## 2015-05-12 ENCOUNTER — Telehealth: Payer: Self-pay | Admitting: Pharmacist

## 2015-05-12 MED ORDER — INSULIN GLARGINE 100 UNIT/ML SOLOSTAR PEN: 20.0000 [IU] | PEN_INJECTOR | Freq: Every day | SUBCUTANEOUS | Status: DC

## 2015-05-12 NOTE — Telephone Encounter (Signed)
Rx sent to [facility] for Lantus.  Patient needed Tanzem - which was already sent in 05/08/15.  Called [facility] and rx was there but was put on hold due to a "new policy".  Asked [facility] fill and they will take care of filling Rx.

## 2015-06-13 ENCOUNTER — Ambulatory Visit: Payer: Medicaid Other | Admitting: Family

## 2015-06-15 ENCOUNTER — Ambulatory Visit: Payer: Medicaid Other | Admitting: Family

## 2015-06-15 ENCOUNTER — Other Ambulatory Visit: Payer: Self-pay | Admitting: Pharmacist

## 2015-06-20 ENCOUNTER — Encounter: Payer: Self-pay | Admitting: Family

## 2015-06-20 ENCOUNTER — Ambulatory Visit (INDEPENDENT_AMBULATORY_CARE_PROVIDER_SITE_OTHER): Payer: Medicaid Other | Admitting: Family

## 2015-06-20 VITALS — BP 123/88 | HR 86 | Temp 98.6°F | Ht 63.0 in | Wt 206.4 lb

## 2015-06-20 DIAGNOSIS — I10 Essential (primary) hypertension: Secondary | ICD-10-CM | POA: Diagnosis not present

## 2015-06-20 DIAGNOSIS — E669 Obesity, unspecified: Secondary | ICD-10-CM | POA: Diagnosis not present

## 2015-06-20 DIAGNOSIS — F329 Major depressive disorder, single episode, unspecified: Secondary | ICD-10-CM

## 2015-06-20 DIAGNOSIS — Z794 Long term (current) use of insulin: Secondary | ICD-10-CM | POA: Diagnosis not present

## 2015-06-20 DIAGNOSIS — E785 Hyperlipidemia, unspecified: Secondary | ICD-10-CM

## 2015-06-20 DIAGNOSIS — M25511 Pain in right shoulder: Secondary | ICD-10-CM

## 2015-06-20 DIAGNOSIS — F419 Anxiety disorder, unspecified: Secondary | ICD-10-CM

## 2015-06-20 DIAGNOSIS — F418 Other specified anxiety disorders: Secondary | ICD-10-CM

## 2015-06-20 DIAGNOSIS — E119 Type 2 diabetes mellitus without complications: Secondary | ICD-10-CM

## 2015-06-20 LAB — BAYER DCA HB A1C WAIVED: HB A1C (BAYER DCA - WAIVED): 8.3 % — ABNORMAL HIGH (ref <7.0)

## 2015-06-20 MED ORDER — PREDNISONE 10 MG (21) PO TBPK: 10.0000 mg | ORAL_TABLET | Freq: Every day | ORAL | Status: DC

## 2015-06-20 MED ORDER — NAPROXEN 500 MG PO TABS: 500.0000 mg | ORAL_TABLET | Freq: Two times a day (BID) | ORAL | Status: DC

## 2015-06-20 NOTE — Patient Instructions (Addendum)
Health Maintenance, Female Adopting a healthy lifestyle and getting preventive care can go a long way to promote health and wellness. Talk with your health care provider about what schedule of regular examinations is right for you. This is a good chance for you to check in with your provider about disease prevention and staying healthy. In between checkups, there are plenty of things you can do on your own. Experts have done a lot of research about which lifestyle changes and preventive measures are most likely to keep you healthy. Ask your health care provider for more information. WEIGHT AND DIET  Eat a healthy diet  Be sure to include plenty of vegetables, fruits, low-fat dairy products, and lean protein.  Do not eat a lot of foods high in solid fats, added sugars, or salt.  Get regular exercise. This is one of the most important things you can do for your health.  Most adults should exercise for at least 150 minutes each week. The exercise should increase your heart rate and make you sweat (moderate-intensity exercise).  Most adults should also do strengthening exercises at least twice a week. This is in addition to the moderate-intensity exercise.  Maintain a healthy weight  Body mass index (BMI) is a measurement that can be used to identify possible weight problems. It estimates body fat based on height and weight. Your health care provider can help determine your BMI and help you achieve or maintain a healthy weight.  For females 20 years of age and older:   A BMI below 18.5 is considered underweight.  A BMI of 18.5 to 24.9 is normal.  A BMI of 25 to 29.9 is considered overweight.  A BMI of 30 and above is considered obese.  Watch levels of cholesterol and blood lipids  You should start having your blood tested for lipids and cholesterol at 39 years of age, then have this test every 5 years.  You may need to have your cholesterol levels checked more often if:  Your lipid  or cholesterol levels are high.  You are older than 39 years of age.  You are at high risk for heart disease.  CANCER SCREENING   Lung Cancer  Lung cancer screening is recommended for adults 55-80 years old who are at high risk for lung cancer because of a history of smoking.  A yearly low-dose CT scan of the lungs is recommended for people who:  Currently smoke.  Have quit within the past 15 years.  Have at least a 30-pack-year history of smoking. A pack year is smoking an average of one pack of cigarettes a day for 1 year.  Yearly screening should continue until it has been 15 years since you quit.  Yearly screening should stop if you develop a health problem that would prevent you from having lung cancer treatment.  Breast Cancer  Practice breast self-awareness. This means understanding how your breasts normally appear and feel.  It also means doing regular breast self-exams. Let your health care provider know about any changes, no matter how small.  If you are in your 20s or 30s, you should have a clinical breast exam (CBE) by a health care provider every 1-3 years as part of a regular health exam.  If you are 40 or older, have a CBE every year. Also consider having a breast X-ray (mammogram) every year.  If you have a family history of breast cancer, talk to your health care provider about genetic screening.  If you   are at high risk for breast cancer, talk to your health care provider about having an MRI and a mammogram every year.  Breast cancer gene (BRCA) assessment is recommended for women who have family members with BRCA-related cancers. BRCA-related cancers include:  Breast.  Ovarian.  Tubal.  Peritoneal cancers.  Results of the assessment will determine the need for genetic counseling and BRCA1 and BRCA2 testing. Cervical Cancer Your health care provider may recommend that you be screened regularly for cancer of the pelvic organs (ovaries, uterus, and  vagina). This screening involves a pelvic examination, including checking for microscopic changes to the surface of your cervix (Pap test). You may be encouraged to have this screening done every 3 years, beginning at age 21.  For women ages 30-65, health care providers may recommend pelvic exams and Pap testing every 3 years, or they may recommend the Pap and pelvic exam, combined with testing for human papilloma virus (HPV), every 5 years. Some types of HPV increase your risk of cervical cancer. Testing for HPV may also be done on women of any age with unclear Pap test results.  Other health care providers may not recommend any screening for nonpregnant women who are considered low risk for pelvic cancer and who do not have symptoms. Ask your health care provider if a screening pelvic exam is right for you.  If you have had past treatment for cervical cancer or a condition that could lead to cancer, you need Pap tests and screening for cancer for at least 20 years after your treatment. If Pap tests have been discontinued, your risk factors (such as having a new sexual partner) need to be reassessed to determine if screening should resume. Some women have medical problems that increase the chance of getting cervical cancer. In these cases, your health care provider may recommend more frequent screening and Pap tests. Colorectal Cancer  This type of cancer can be detected and often prevented.  Routine colorectal cancer screening usually begins at 39 years of age and continues through 39 years of age.  Your health care provider may recommend screening at an earlier age if you have risk factors for colon cancer.  Your health care provider may also recommend using home test kits to check for hidden blood in the stool.  A small camera at the end of a tube can be used to examine your colon directly (sigmoidoscopy or colonoscopy). This is done to check for the earliest forms of colorectal  cancer.  Routine screening usually begins at age 50.  Direct examination of the colon should be repeated every 5-10 years through 39 years of age. However, you may need to be screened more often if early forms of precancerous polyps or small growths are found. Skin Cancer  Check your skin from head to toe regularly.  Tell your health care provider about any new moles or changes in moles, especially if there is a change in a mole's shape or color.  Also tell your health care provider if you have a mole that is larger than the size of a pencil eraser.  Always use sunscreen. Apply sunscreen liberally and repeatedly throughout the day.  Protect yourself by wearing long sleeves, pants, a wide-brimmed hat, and sunglasses whenever you are outside. HEART DISEASE, DIABETES, AND HIGH BLOOD PRESSURE   High blood pressure causes heart disease and increases the risk of stroke. High blood pressure is more likely to develop in:  People who have blood pressure in the high end   of the normal range (130-139/85-89 mm Hg).  People who are overweight or obese.  People who are African American.  If you are 38-23 years of age, have your blood pressure checked every 3-5 years. If you are 61 years of age or older, have your blood pressure checked every year. You should have your blood pressure measured twice--once when you are at a hospital or clinic, and once when you are not at a hospital or clinic. Record the average of the two measurements. To check your blood pressure when you are not at a hospital or clinic, you can use:  An automated blood pressure machine at a pharmacy.  A home blood pressure monitor.  If you are between 45 years and 39 years old, ask your health care provider if you should take aspirin to prevent strokes.  Have regular diabetes screenings. This involves taking a blood sample to check your fasting blood sugar level.  If you are at a normal weight and have a low risk for diabetes,  have this test once every three years after 39 years of age.  If you are overweight and have a high risk for diabetes, consider being tested at a younger age or more often. PREVENTING INFECTION  Hepatitis B  If you have a higher risk for hepatitis B, you should be screened for this virus. You are considered at high risk for hepatitis B if:  You were born in a country where hepatitis B is common. Ask your health care provider which countries are considered high risk.  Your parents were born in a high-risk country, and you have not been immunized against hepatitis B (hepatitis B vaccine).  You have HIV or AIDS.  You use needles to inject street drugs.  You live with someone who has hepatitis B.  You have had sex with someone who has hepatitis B.  You get hemodialysis treatment.  You take certain medicines for conditions, including cancer, organ transplantation, and autoimmune conditions. Hepatitis C  Blood testing is recommended for:  Everyone born from [birth year] through 1964.  Anyone with known risk factors for hepatitis C. Sexually transmitted infections (STIs)  You should be screened for sexually transmitted infections (STIs) including gonorrhea and chlamydia if:  You are sexually active and are younger than 39 years of age.  You are older than 39 years of age and your health care provider tells you that you are at risk for this type of infection.  Your sexual activity has changed since you were last screened and you are at an increased risk for chlamydia or gonorrhea. Ask your health care provider if you are at risk.  If you do not have HIV, but are at risk, it may be recommended that you take a prescription medicine daily to prevent HIV infection. This is called pre-exposure prophylaxis (PrEP). You are considered at risk if:  You are sexually active and do not regularly use condoms or know the HIV status of your partner(s).  You take drugs by injection.  You are sexually  active with a partner who has HIV. Talk with your health care provider about whether you are at high risk of being infected with HIV. If you choose to begin PrEP, you should first be tested for HIV. You should then be tested every 3 months for as long as you are taking PrEP.  PREGNANCY   If you are premenopausal and you may become pregnant, ask your health care provider about preconception counseling.  If you may  become pregnant, take 400 to 800 micrograms (mcg) of folic acid every day.  If you want to prevent pregnancy, talk to your health care provider about birth control (contraception). OSTEOPOROSIS AND MENOPAUSE   Osteoporosis is a disease in which the bones lose minerals and strength with aging. This can result in serious bone fractures. Your risk for osteoporosis can be identified using a bone density scan.  If you are 22 years of age or older, or if you are at risk for osteoporosis and fractures, ask your health care provider if you should be screened.  Ask your health care provider whether you should take a calcium or vitamin D supplement to lower your risk for osteoporosis.  Menopause may have certain physical symptoms and risks.  Hormone replacement therapy may reduce some of these symptoms and risks. Talk to your health care provider about whether hormone replacement therapy is right for you.  HOME CARE INSTRUCTIONS   Schedule regular health, dental, and eye exams.  Stay current with your immunizations.   Do not use any tobacco products including cigarettes, chewing tobacco, or electronic cigarettes.  If you are pregnant, do not drink alcohol.  If you are breastfeeding, limit how much and how often you drink alcohol.  Limit alcohol intake to no more than 1 drink per day for nonpregnant women. One drink equals 12 ounces of beer, 5 ounces of wine, or 1 ounces of hard liquor.  Do not use street drugs.  Do not share needles.  Ask your health care provider for help if  you need support or information about quitting drugs.  Tell your health care provider if you often feel depressed.  Tell your health care provider if you have ever been abused or do not feel safe at home.   This information is not intended to replace advice given to you by your health care provider. Make sure you discuss any questions you have with your health care provider.   Document Released: 10/15/2010 Document Revised: 04/22/2014 Document Reviewed: 03/03/2013 Elsevier Interactive Patient Education 2016 [employer] Shoulder Pain The shoulder is the joint that connects your arms to your body. The bones that form the shoulder joint include the upper arm bone (humerus), the shoulder blade (scapula), and the collarbone (clavicle). The top of the humerus is shaped like a ball and fits into a rather flat socket on the scapula (glenoid cavity). A combination of muscles and strong, fibrous tissues that connect muscles to bones (tendons) support your shoulder joint and hold the ball in the socket. Small, fluid-filled sacs (bursae) are located in different areas of the joint. They act as cushions between the bones and the overlying soft tissues and help reduce friction between the gliding tendons and the bone as you move your arm. Your shoulder joint allows a wide range of motion in your arm. This range of motion allows you to do things like scratch your back or throw a ball. However, this range of motion also makes your shoulder more prone to pain from overuse and injury. Causes of shoulder pain can originate from both injury and overuse and usually can be grouped in the following four categories:  Redness, swelling, and pain (inflammation) of the tendon (tendinitis) or the bursae (bursitis).  Instability, such as a dislocation of the joint.  Inflammation of the joint (arthritis).  Broken bone (fracture). HOME CARE INSTRUCTIONS   Apply ice to the sore area.  Put ice in a plastic bag.  Place  a towel between your  skin and the bag.  Leave the ice on for 15-20 minutes, 3-4 times per day for the first 2 days, or as directed by your health care provider.  Stop using cold packs if they do not help with the pain.  If you have a shoulder sling or immobilizer, wear it as long as your caregiver instructs. Only remove it to shower or bathe. Move your arm as little as possible, but keep your hand moving to prevent swelling.  Squeeze a soft ball or foam pad as much as possible to help prevent swelling.  Only take over-the-counter or prescription medicines for pain, discomfort, or fever as directed by your caregiver. SEEK MEDICAL CARE IF:   Your shoulder pain increases, or new pain develops in your arm, hand, or fingers.  Your hand or fingers become cold and numb.  Your pain is not relieved with medicines. SEEK IMMEDIATE MEDICAL CARE IF:   Your arm, hand, or fingers are numb or tingling.  Your arm, hand, or fingers are significantly swollen or turn white or blue. MAKE SURE YOU:   Understand these instructions.  Will watch your condition.  Will get help right away if you are not doing well or get worse.   This information is not intended to replace advice given to you by your health care provider. Make sure you discuss any questions you have with your health care provider.   Document Released: 01/09/2005 Document Revised: 04/22/2014 Document Reviewed: 07/25/2014 Elsevier Interactive Patient Education [employer].

## 2015-06-20 NOTE — Progress Notes (Signed)
Subjective:    Patient ID: Kiara Mccall, female    DOB: [birth year], 40 y.o.   MRN: [id number]  Pt presents to the office today for chronic follow up for diabetes.  No hypoglycemic episodes. She has started walking for 30 minutes every day.  Diabetes She presents for her follow-up diabetic visit. She has type 2 diabetes mellitus. No MedicAlert identification noted. Her disease course has been stable. There are no hypoglycemic associated symptoms. Pertinent negatives for hypoglycemia include no confusion or headaches. Pertinent negatives for diabetes include no chest pain, no fatigue, no foot paresthesias, no foot ulcerations, no polydipsia, no polyphagia and no visual change. There are no hypoglycemic complications. Symptoms are improving. There are no diabetic complications. Pertinent negatives for diabetic complications include no CVA, heart disease, nephropathy or peripheral neuropathy. Risk factors for coronary artery disease include hypertension, diabetes mellitus and dyslipidemia. Current diabetic treatment includes insulin injections and oral agent (monotherapy). She is compliant with treatment all of the time. Her weight is stable. She is following a generally healthy diet. When asked about meal planning, she reported none. She has had a previous visit with a dietitian. She rarely participates in exercise. She monitors blood glucose at home 3-4 x per day. Her home blood glucose trend is decreasing steadily. Her breakfast blood glucose range is generally 140-180 mg/dl. Her dinner blood glucose range is generally 110-130 mg/dl. An ACE inhibitor/angiotensin II receptor blocker is being taken. She does not see a podiatrist.Eye exam is current.  Hyperlipidemia This is a chronic problem. The current episode started more than 1 year ago. The problem is uncontrolled. Recent lipid tests were reviewed and are high. Pertinent negatives include no chest pain or shortness of breath. Current  antihyperlipidemic treatment includes statins and exercise. The current treatment provides mild improvement of lipids. Risk factors for coronary artery disease include dyslipidemia, diabetes mellitus, family history, hypertension, obesity and post-menopausal.  Depression      The patient presents with depression.  This is a chronic problem.  The current episode started more than 1 year ago.   The onset quality is gradual.   The problem occurs intermittently.  The problem has been gradually worsening since onset.  Associated symptoms include insomnia and restlessness.  Associated symptoms include no fatigue, not irritable and no headaches.     The symptoms are aggravated by family issues.  Past treatments include SSRIs - Selective serotonin reuptake inhibitors.  Compliance with treatment is good.  Past medical history includes depression.     Pertinent negatives include no anxiety. Anxiety Presents for follow-up visit. Onset was more than 5 years ago. The problem has been resolved. Symptoms include depressed mood, excessive worry, insomnia and restlessness. Patient reports no chest pain, confusion, malaise, palpitations or shortness of breath. Symptoms occur occasionally. The severity of symptoms is moderate. The symptoms are aggravated by family issues. The quality of sleep is good.   Her past medical history is significant for anxiety/panic attacks and depression. Past treatments include SSRIs. The treatment provided moderate relief. Compliance with prior treatments has been good.  Shoulder Pain  The pain is present in the right shoulder. This is a new problem. The current episode started more than 1 month ago. There has been no history of extremity trauma. The problem occurs constantly. The problem has been waxing and waning. The quality of the pain is described as aching. The pain is at a severity of 8/10. The pain is moderate. Pertinent negatives include no inability to bear weight, joint  locking, joint  swelling, numbness or stiffness. The symptoms are aggravated by activity. She has tried NSAIDS, acetaminophen and rest for the symptoms. The treatment provided mild relief.      Review of Systems  Constitutional: Negative.  Negative for fatigue.  HENT: Negative.   Eyes: Negative.   Respiratory: Negative.  Negative for shortness of breath.   Cardiovascular: Negative.  Negative for chest pain and palpitations.  Gastrointestinal: Negative.   Endocrine: Negative.  Negative for polydipsia and polyphagia.  Genitourinary: Negative.   Musculoskeletal: Negative for stiffness.       Right shoulder pain  Neurological: Negative.  Negative for numbness and headaches.  Hematological: Negative.   Psychiatric/Behavioral: Positive for depression. Negative for confusion. The patient has insomnia.   All other systems reviewed and are negative.      Objective:   Physical Exam  Constitutional: She is oriented to person, place, and time. She appears well-developed and well-nourished. She is not irritable. No distress.  HENT:  Head: Normocephalic and atraumatic.  Right Ear: External ear normal.  Left Ear: External ear normal.  Nose: Nose normal.  Mouth/Throat: Oropharynx is clear and moist.  Eyes: Pupils are equal, round, and reactive to light.  Neck: Normal range of motion. Neck supple. No thyromegaly present.  Cardiovascular: Normal rate, regular rhythm, normal heart sounds and intact distal pulses.   No murmur heard. Pulmonary/Chest: Effort normal and breath sounds normal. No respiratory distress. She has no wheezes.  Abdominal: Soft. Bowel sounds are normal. She exhibits no distension. There is no tenderness.  Musculoskeletal: Normal range of motion. She exhibits no edema or tenderness.  Neurological: She is alert and oriented to person, place, and time. She has normal reflexes. No cranial nerve deficit.  Skin: Skin is warm and dry.  Psychiatric: She has a normal mood and affect. Her behavior  is normal. Judgment and thought content normal.  Vitals reviewed.    BP 123/88 mmHg  Pulse 86  Temp(Src) 98.6 F (37 C) (Oral)  Ht _0  (1.6 m)  Wt 206 lb 6.4 oz (93.622 kg)  BMI 36.57 kg/m2  LMP  (Approximate)      Assessment & Plan:  1. Essential hypertension - CMP14+EGFR  2. Hyperlipidemia - CMP14+EGFR - Lipid panel  3. Anxiety and depression - CMP14+EGFR  4. Obesity - CMP14+EGFR  5. Diabetes mellitus type 2, insulin dependent (HCC) - Bayer DCA Hb A1c Waived - CMP14+EGFR - Microalbumin / creatinine urine ratio  6. Right shoulder pain -Low carb diet!! Rest Ice and heat as needed RTO in 2 weeks and if not improved will give joint injection - naproxen (NAPROSYN) 500 MG tablet; Take 1 tablet (500 mg total) by mouth 2 (two) times daily with a meal.  Dispense: 60 tablet; Refill: 1 - predniSONE (STERAPRED UNI-PAK 21 TAB) 10 MG (21) TBPK tablet; Take 1 tablet (10 mg total) by mouth daily. As directed x 6 days  Dispense: 21 tablet; Refill: 0   Continue all meds Labs pending Health Maintenance reviewed Diet and exercise encouraged RTO 2 weeks to recheck shoulder pain  Evelina Dun, FNP

## 2015-06-21 LAB — CMP14+EGFR
ALK PHOS: 81 IU/L (ref 39–117)
ALT: 11 IU/L (ref 0–32)
AST: 8 IU/L (ref 0–40)
Albumin/Globulin Ratio: 1.3 (ref 1.1–2.5)
Albumin: 4.3 g/dL (ref 3.5–5.5)
BILIRUBIN TOTAL: 0.4 mg/dL (ref 0.0–1.2)
BUN / CREAT RATIO: 18 (ref 8–20)
BUN: 10 mg/dL (ref 6–20)
CALCIUM: 9.5 mg/dL (ref 8.7–10.2)
CO2: 23 mmol/L (ref 18–29)
CREATININE: 0.56 mg/dL — AB (ref 0.57–1.00)
Chloride: 96 mmol/L (ref 96–106)
GFR calc non Af Amer: 119 mL/min/{1.73_m2} (ref >59)
GFR, EST AFRICAN AMERICAN: 137 mL/min/{1.73_m2} (ref >59)
GLUCOSE: 187 mg/dL — AB (ref 65–99)
Globulin, Total: 3.3 g/dL (ref 1.5–4.5)
Potassium: 4.5 mmol/L (ref 3.5–5.2)
SODIUM: 134 mmol/L (ref 134–144)
Total Protein: 7.6 g/dL (ref 6.0–8.5)

## 2015-06-21 LAB — LIPID PANEL
CHOLESTEROL TOTAL: 229 mg/dL — AB (ref 100–199)
Chol/HDL Ratio: 7.2 ratio units — ABNORMAL HIGH (ref 0.0–4.4)
HDL: 32 mg/dL — ABNORMAL LOW (ref >39)
LDL CALC: 174 mg/dL — AB (ref 0–99)
Triglycerides: 113 mg/dL (ref 0–149)
VLDL CHOLESTEROL CAL: 23 mg/dL (ref 5–40)

## 2015-06-21 LAB — MICROALBUMIN / CREATININE URINE RATIO
CREATININE, UR: 193.8 mg/dL
MICROALB/CREAT RATIO: 159.6 mg/g{creat} — AB (ref 0.0–30.0)
MICROALBUM., U, RANDOM: 309.3 ug/mL

## 2015-06-22 ENCOUNTER — Other Ambulatory Visit: Payer: Self-pay | Admitting: Family

## 2015-07-06 ENCOUNTER — Ambulatory Visit (INDEPENDENT_AMBULATORY_CARE_PROVIDER_SITE_OTHER): Payer: Medicaid Other | Admitting: Family

## 2015-07-06 ENCOUNTER — Encounter: Payer: Self-pay | Admitting: Family

## 2015-07-06 ENCOUNTER — Ambulatory Visit (INDEPENDENT_AMBULATORY_CARE_PROVIDER_SITE_OTHER): Payer: Medicaid Other

## 2015-07-06 VITALS — BP 122/88 | HR 89 | Temp 97.4°F | Ht 63.0 in | Wt 204.0 lb

## 2015-07-06 DIAGNOSIS — M25511 Pain in right shoulder: Secondary | ICD-10-CM

## 2015-07-06 NOTE — Addendum Note (Signed)
Addended by: Jannifer RodneyHAWKS, Garhett Bernhard A on: 07/06/2015 10:25 AM   Modules accepted: SmartSet

## 2015-07-06 NOTE — Patient Instructions (Signed)

## 2015-07-06 NOTE — Progress Notes (Addendum)
   Subjective:    Patient ID: Kiara Mccall, female    DOB: [birth year], 40 y.o.   MRN: [id number]  Pt presents to the office today to recheck right shoulder pain. Pt was given rx of naprosyn and prednisone dose pack. Pt states it did not seem to help.  Shoulder Pain  The pain is present in the right shoulder. This is a new problem. The current episode started more than 1 year ago. There has been no history of extremity trauma. The problem occurs intermittently. The problem has been unchanged. The quality of the pain is described as aching. The pain is at a severity of 8/10. The pain is moderate. Pertinent negatives include no inability to bear weight, itching, joint swelling, limited range of motion, numbness or stiffness. She has tried acetaminophen, NSAIDS, rest, heat and cold for the symptoms. The treatment provided mild relief. Family history does not include gout or rheumatoid arthritis.      Review of Systems  Constitutional: Negative.   HENT: Negative.   Eyes: Negative.   Respiratory: Negative.  Negative for shortness of breath.   Cardiovascular: Negative.  Negative for palpitations.  Gastrointestinal: Negative.   Endocrine: Negative.   Genitourinary: Negative.   Musculoskeletal: Negative.  Negative for stiffness.  Skin: Negative for itching.  Neurological: Negative.  Negative for numbness and headaches.  Hematological: Negative.   Psychiatric/Behavioral: Negative.   All other systems reviewed and are negative.      Objective:   Physical Exam  Constitutional: She is oriented to person, place, and time. She appears well-developed and well-nourished. No distress.  HENT:  Head: Normocephalic and atraumatic.  Eyes: Pupils are equal, round, and reactive to light.  Neck: Normal range of motion. Neck supple. No thyromegaly present.  Cardiovascular: Normal rate, regular rhythm, normal heart sounds and intact distal pulses.   No murmur heard. Pulmonary/Chest: Effort normal and  breath sounds normal. No respiratory distress. She has no wheezes.  Abdominal: Soft. Bowel sounds are normal. She exhibits no distension. There is no tenderness.  Musculoskeletal: Normal range of motion. She exhibits no edema or tenderness.  Full ROM of right shoulder  Neurological: She is alert and oriented to person, place, and time. She has normal reflexes. No cranial nerve deficit.  Skin: Skin is warm and dry.  Psychiatric: She has a normal mood and affect. Her behavior is normal. Judgment and thought content normal.  Vitals reviewed.   BP 122/88 mmHg  Pulse 89  Temp(Src) 97.4 F (36.3 C) (Oral)  Ht 5\' 3"  (1.6 m)  Wt 204 lb (92.534 kg)  BMI 36.15 kg/m2  LMP  (Approximate)  Right shoulder x-ray- WNL Preliminary reading by Jannifer Rodneyhristy Keilyn Nadal, FNP [facility]      Assessment & Plan:  1. Right shoulder pain -ROM exerices discussed -Continue naprosyn BID  -Ice or heat as needed for pain -RTO in 3 months for pap and chronic follow up - DG Shoulder Right; Future - Ambulatory referral to Physical Therapy  Jannifer Rodneyhristy Eshaan Titzer, FNP

## 2015-08-03 ENCOUNTER — Telehealth: Payer: Self-pay | Admitting: Pharmacist

## 2015-08-03 NOTE — Telephone Encounter (Signed)
I didn't call her .

## 2015-08-03 NOTE — Telephone Encounter (Signed)
Patient advised that we have not called and I gave her the number to physical therapy to see if it was them.

## 2015-08-07 ENCOUNTER — Other Ambulatory Visit: Payer: Self-pay | Admitting: Family

## 2015-08-19 ENCOUNTER — Other Ambulatory Visit: Payer: Self-pay | Admitting: Family

## 2015-08-28 ENCOUNTER — Ambulatory Visit: Payer: Self-pay | Admitting: Physical Therapy

## 2015-09-26 ENCOUNTER — Other Ambulatory Visit: Payer: Self-pay | Admitting: *Deleted

## 2015-09-26 MED ORDER — PEN NEEDLES 32G X 4 MM MISC: 1.0000 | Freq: Four times a day (QID) | Status: DC

## 2015-10-06 ENCOUNTER — Other Ambulatory Visit: Payer: Self-pay | Admitting: Family

## 2015-10-06 ENCOUNTER — Ambulatory Visit (INDEPENDENT_AMBULATORY_CARE_PROVIDER_SITE_OTHER): Payer: Medicaid Other | Admitting: Family

## 2015-10-06 ENCOUNTER — Encounter: Payer: Self-pay | Admitting: Family

## 2015-10-06 VITALS — BP 119/81 | HR 85 | Temp 97.5°F | Ht 63.0 in | Wt 209.8 lb

## 2015-10-06 DIAGNOSIS — E119 Type 2 diabetes mellitus without complications: Secondary | ICD-10-CM

## 2015-10-06 DIAGNOSIS — Z0000 Encounter for general adult medical examination without abnormal findings: Secondary | ICD-10-CM

## 2015-10-06 DIAGNOSIS — Z01419 Encounter for gynecological examination (general) (routine) without abnormal findings: Secondary | ICD-10-CM | POA: Diagnosis not present

## 2015-10-06 DIAGNOSIS — F32A Depression, unspecified: Secondary | ICD-10-CM

## 2015-10-06 DIAGNOSIS — F419 Anxiety disorder, unspecified: Secondary | ICD-10-CM

## 2015-10-06 DIAGNOSIS — Z794 Long term (current) use of insulin: Secondary | ICD-10-CM

## 2015-10-06 DIAGNOSIS — E785 Hyperlipidemia, unspecified: Secondary | ICD-10-CM

## 2015-10-06 DIAGNOSIS — F329 Major depressive disorder, single episode, unspecified: Secondary | ICD-10-CM

## 2015-10-06 DIAGNOSIS — I10 Essential (primary) hypertension: Secondary | ICD-10-CM

## 2015-10-06 DIAGNOSIS — N898 Other specified noninflammatory disorders of vagina: Secondary | ICD-10-CM

## 2015-10-06 DIAGNOSIS — E669 Obesity, unspecified: Secondary | ICD-10-CM

## 2015-10-06 DIAGNOSIS — Z113 Encounter for screening for infections with a predominantly sexual mode of transmission: Secondary | ICD-10-CM

## 2015-10-06 LAB — BAYER DCA HB A1C WAIVED: HB A1C (BAYER DCA - WAIVED): 9.2 % — ABNORMAL HIGH (ref <7.0)

## 2015-10-06 LAB — WET PREP FOR TRICH, YEAST, CLUE
Clue Cell Exam: POSITIVE — AB
Trichomonas Exam: NEGATIVE
Yeast Exam: NEGATIVE

## 2015-10-06 NOTE — Progress Notes (Signed)
Subjective:    Patient ID: Kiara Mccall, female    DOB: 1976-04-13, 40 y.o.   MRN: [id number]  Pt presents to the office today for CPE with pap.  Gynecologic Exam The patient's primary symptoms include genital itching, a genital odor and vaginal discharge. The patient's pertinent negatives include no genital lesions or vaginal bleeding. Pertinent negatives include no back pain, chills, diarrhea, headaches, hematuria, nausea, painful intercourse or sore throat.  Diabetes She presents for her follow-up diabetic visit. She has type 2 diabetes mellitus. Her disease course has been fluctuating. Hypoglycemia symptoms include nervousness/anxiousness. Pertinent negatives for hypoglycemia include no confusion or headaches. Pertinent negatives for diabetes include no blurred vision, no fatigue, no foot paresthesias, no foot ulcerations and no visual change. There are no hypoglycemic complications. Symptoms are stable. Pertinent negatives for diabetic complications include no CVA, heart disease, nephropathy or peripheral neuropathy. Current diabetic treatment includes insulin injections and oral agent (monotherapy). She is compliant with treatment all of the time. She is following a generally healthy diet. She rarely participates in exercise. Her breakfast blood glucose range is generally 140-180 mg/dl. An ACE inhibitor/angiotensin II receptor blocker is being taken. Eye exam is current.  Hypertension This is a chronic problem. The current episode started more than 1 year ago. The problem has been resolved since onset. The problem is controlled. Pertinent negatives include no anxiety, blurred vision, headaches, palpitations, peripheral edema or shortness of breath. Risk factors for coronary artery disease include dyslipidemia, obesity, post-menopausal state, sedentary lifestyle and family history. Past treatments include ACE inhibitors and beta blockers. There is no history of kidney disease, CAD/MI, CVA or  heart failure.  Hyperlipidemia This is a chronic problem. The current episode started more than 1 year ago. The problem is uncontrolled. Recent lipid tests were reviewed and are high. Pertinent negatives include no shortness of breath. Current antihyperlipidemic treatment includes statins. The current treatment provides mild improvement of lipids. Risk factors for coronary artery disease include dyslipidemia, diabetes mellitus, family history, hypertension, obesity and post-menopausal.  Depression      The patient presents with depression.  This is a chronic problem.  The current episode started more than 1 year ago.   The onset quality is gradual.   The problem occurs intermittently.  The problem has been gradually worsening since onset.  Associated symptoms include restlessness and sad.  Associated symptoms include no fatigue, does not have insomnia, not irritable and no headaches.  Past treatments include SSRIs - Selective serotonin reuptake inhibitors.  Past medical history includes depression.     Pertinent negatives include no anxiety. Anxiety Presents for follow-up visit. Onset was more than 5 years ago. The problem has been resolved. Symptoms include depressed mood, excessive worry, irritability, nervous/anxious behavior and restlessness. Patient reports no confusion, insomnia, malaise, nausea, palpitations or shortness of breath. Symptoms occur occasionally. The symptoms are aggravated by family issues. The quality of sleep is good.   Her past medical history is significant for anxiety/panic attacks and depression. Past treatments include SSRIs. The treatment provided moderate relief. Compliance with prior treatments has been good.  Vaginal Discharge The patient's primary symptoms include genital itching, a genital odor and vaginal discharge. The patient's pertinent negatives include no genital lesions or vaginal bleeding. This is a new problem. The current episode started in the past 7 days. The  problem occurs constantly. The problem has been unchanged. The patient is experiencing no pain. Pertinent negatives include no back pain, chills, diarrhea, headaches, hematuria, nausea, painful intercourse or  sore throat. The vaginal discharge was brown.      Review of Systems  Constitutional: Positive for irritability. Negative for chills and fatigue.  HENT: Negative.  Negative for sore throat.   Eyes: Negative.  Negative for blurred vision.  Respiratory: Negative.  Negative for shortness of breath.   Cardiovascular: Negative.  Negative for palpitations.  Gastrointestinal: Negative.  Negative for nausea and diarrhea.  Endocrine: Negative.   Genitourinary: Positive for vaginal discharge. Negative for hematuria.  Musculoskeletal: Negative.  Negative for back pain.  Neurological: Negative.  Negative for headaches.  Hematological: Negative.   Psychiatric/Behavioral: Positive for depression. Negative for confusion. The patient is nervous/anxious. The patient does not have insomnia.   All other systems reviewed and are negative.  Family History  Problem Relation Age of Onset  . Asthma Mother   . Diabetes Mother   . Heart disease Mother 59    dx'd with CHF  . Hypertension Mother   . Kidney disease Mother   . Diabetes Sister   . Hypertension Sister   . Diabetes Brother     type 1  . Heart disease Brother 59    dx'd with CHF  . Hypertension Brother   . Kidney disease Brother   . Vision loss Brother   . Asthma Son   . Diabetes Maternal Grandmother   . Cancer Maternal Grandfather 70    deceased at age 43, leukemia  . Early death Brother 1    brother passed away at 74 weeks old due to spinal meningitis    Social History   Social History  . Marital Status: Single    Spouse Name: N/A  . Number of Children: N/A  . Years of Education: N/A   Social History Main Topics  . Smoking status: Former Smoker    Types: Cigarettes    Quit date: 04/15/2002  . Smokeless tobacco: Never  Used  . Alcohol Use: No  . Drug Use: No  . Sexual Activity: Yes    Birth Control/ Protection: None   Other Topics Concern  . None   Social History Narrative       Objective:   Physical Exam  Constitutional: She is oriented to person, place, and time. She appears well-developed and well-nourished. She is not irritable. No distress.  HENT:  Head: Normocephalic and atraumatic.  Right Ear: External ear normal.  Left Ear: External ear normal.  Nose: Nose normal.  Mouth/Throat: Oropharynx is clear and moist.  Eyes: Pupils are equal, round, and reactive to light.  Neck: Normal range of motion. Neck supple. No thyromegaly present.  Cardiovascular: Normal rate, regular rhythm, normal heart sounds and intact distal pulses.   No murmur heard. Pulmonary/Chest: Effort normal and breath sounds normal. No respiratory distress. She has no wheezes. Right breast exhibits no inverted nipple, no mass, no nipple discharge, no skin change and no tenderness. Left breast exhibits no inverted nipple, no mass, no nipple discharge, no skin change and no tenderness. Breasts are symmetrical.  Abdominal: Soft. Bowel sounds are normal. She exhibits no distension. There is no tenderness.  Genitourinary: Vagina normal.  Bimanual exam- no adnexal masses or tenderness, ovaries nonpalpable   Cervix parous and pink- No discharge   Musculoskeletal: Normal range of motion. She exhibits no edema or tenderness.  Neurological: She is alert and oriented to person, place, and time. She has normal reflexes. No cranial nerve deficit.  Skin: Skin is warm and dry.  Psychiatric: She has a normal mood and affect. Her behavior  is normal. Judgment and thought content normal.  Vitals reviewed.    BP 119/81 mmHg  Pulse 85  Temp(Src) 97.5 F (36.4 C) (Oral)  Ht 5' 3" (1.6 m)  Wt 209 lb 12.8 oz (95.165 kg)  BMI 37.17 kg/m2  LMP 07/26/2015      Assessment & Plan:  1. Essential hypertension - CMP14+EGFR  2. Diabetes  mellitus type 2, insulin dependent (HCC) - Bayer DCA Hb A1c Waived - CMP14+EGFR - Microalbumin / creatinine urine ratio - Ambulatory referral to Ophthalmology  3. Hyperlipidemia - CMP14+EGFR - Lipid panel  4. Anxiety and depression - CMP14+EGFR  5. Obesity (BMI 30-39.9) - CMP14+EGFR  6. Screening for STDs (sexually transmitted diseases) - CMP14+EGFR  7. Laboratory examination ordered as part of a complete physical examination - Bayer DCA Hb A1c Waived - CMP14+EGFR - Lipid panel - Anemia Profile B - Thyroid Panel With TSH - VITAMIN D 25 Hydroxy (Vit-D Deficiency, Fractures) - Microalbumin / creatinine urine ratio - Pap IG w/ reflex to HPV when [facility]-U - WET PREP FOR TRICH, YEAST, CLUE - STD Screen (8)  8. Encounter for routine gynecological examination - CMP14+EGFR - Pap IG w/ reflex to HPV when ASC-U  9. Vaginal discharge - CMP14+EGFR - Pap IG w/ reflex to HPV when [facility]-U - WET PREP FOR TRICH, YEAST, CLUE - STD Screen (8)   Continue all meds Labs pending Health Maintenance reviewed Diet and exercise encouraged RTO 3 months  Evelina Dun, FNP

## 2015-10-06 NOTE — Patient Instructions (Signed)
Health Maintenance, Female Adopting a healthy lifestyle and getting preventive care can go a long way to promote health and wellness. Talk with your health care provider about what schedule of regular examinations is right for you. This is a good chance for you to check in with your provider about disease prevention and staying healthy. In between checkups, there are plenty of things you can do on your own. Experts have done a lot of research about which lifestyle changes and preventive measures are most likely to keep you healthy. Ask your health care provider for more information. WEIGHT AND DIET  Eat a healthy diet  Be sure to include plenty of vegetables, fruits, low-fat dairy products, and lean protein.  Do not eat a lot of foods high in solid fats, added sugars, or salt.  Get regular exercise. This is one of the most important things you can do for your health.  Most adults should exercise for at least 150 minutes each week. The exercise should increase your heart rate and make you sweat (moderate-intensity exercise).  Most adults should also do strengthening exercises at least twice a week. This is in addition to the moderate-intensity exercise.  Maintain a healthy weight  Body mass index (BMI) is a measurement that can be used to identify possible weight problems. It estimates body fat based on height and weight. Your health care provider can help determine your BMI and help you achieve or maintain a healthy weight.  For females 80 years of age and older:   A BMI below 18.5 is considered underweight.  A BMI of 18.5 to 24.9 is normal.  A BMI of 25 to 29.9 is considered overweight.  A BMI of 30 and above is considered obese.  Watch levels of cholesterol and blood lipids  You should start having your blood tested for lipids and cholesterol at 38 years of age, then have this test every 5 years.  You may need to have your cholesterol levels checked more often if:  Your lipid  or cholesterol levels are high.  You are older than 39 years of age.  You are at high risk for heart disease.  CANCER SCREENING   Lung Cancer  Lung cancer screening is recommended for adults 45-30 years old who are at high risk for lung cancer because of a history of smoking.  A yearly low-dose CT scan of the lungs is recommended for people who:  Currently smoke.  Have quit within the past 15 years.  Have at least a 30-pack-year history of smoking. A pack year is smoking an average of one pack of cigarettes a day for 1 year.  Yearly screening should continue until it has been 15 years since you quit.  Yearly screening should stop if you develop a health problem that would prevent you from having lung cancer treatment.  Breast Cancer  Practice breast self-awareness. This means understanding how your breasts normally appear and feel.  It also means doing regular breast self-exams. Let your health care provider know about any changes, no matter how small.  If you are in your 20s or 30s, you should have a clinical breast exam (CBE) by a health care provider every 1-3 years as part of a regular health exam.  If you are 56 or older, have a CBE every year. Also consider having a breast X-ray (mammogram) every year.  If you have a family history of breast cancer, talk to your health care provider about genetic screening.  If you  are at high risk for breast cancer, talk to your health care provider about having an MRI and a mammogram every year.  Breast cancer gene (BRCA) assessment is recommended for women who have family members with BRCA-related cancers. BRCA-related cancers include:  Breast.  Ovarian.  Tubal.  Peritoneal cancers.  Results of the assessment will determine the need for genetic counseling and BRCA1 and BRCA2 testing. Cervical Cancer Your health care provider may recommend that you be screened regularly for cancer of the pelvic organs (ovaries, uterus, and  vagina). This screening involves a pelvic examination, including checking for microscopic changes to the surface of your cervix (Pap test). You may be encouraged to have this screening done every 3 years, beginning at age 21.  For women ages 30-65, health care providers may recommend pelvic exams and Pap testing every 3 years, or they may recommend the Pap and pelvic exam, combined with testing for human papilloma virus (HPV), every 5 years. Some types of HPV increase your risk of cervical cancer. Testing for HPV may also be done on women of any age with unclear Pap test results.  Other health care providers may not recommend any screening for nonpregnant women who are considered low risk for pelvic cancer and who do not have symptoms. Ask your health care provider if a screening pelvic exam is right for you.  If you have had past treatment for cervical cancer or a condition that could lead to cancer, you need Pap tests and screening for cancer for at least 20 years after your treatment. If Pap tests have been discontinued, your risk factors (such as having a new sexual partner) need to be reassessed to determine if screening should resume. Some women have medical problems that increase the chance of getting cervical cancer. In these cases, your health care provider may recommend more frequent screening and Pap tests. Colorectal Cancer  This type of cancer can be detected and often prevented.  Routine colorectal cancer screening usually begins at 39 years of age and continues through 39 years of age.  Your health care provider may recommend screening at an earlier age if you have risk factors for colon cancer.  Your health care provider may also recommend using home test kits to check for hidden blood in the stool.  A small camera at the end of a tube can be used to examine your colon directly (sigmoidoscopy or colonoscopy). This is done to check for the earliest forms of colorectal  cancer.  Routine screening usually begins at age 50.  Direct examination of the colon should be repeated every 5-10 years through 39 years of age. However, you may need to be screened more often if early forms of precancerous polyps or small growths are found. Skin Cancer  Check your skin from head to toe regularly.  Tell your health care provider about any new moles or changes in moles, especially if there is a change in a mole's shape or color.  Also tell your health care provider if you have a mole that is larger than the size of a pencil eraser.  Always use sunscreen. Apply sunscreen liberally and repeatedly throughout the day.  Protect yourself by wearing long sleeves, pants, a wide-brimmed hat, and sunglasses whenever you are outside. HEART DISEASE, DIABETES, AND HIGH BLOOD PRESSURE   High blood pressure causes heart disease and increases the risk of stroke. High blood pressure is more likely to develop in:  People who have blood pressure in the high end   of the normal range (130-139/85-89 mm Hg).  People who are overweight or obese.  People who are African American.  If you are 38-23 years of age, have your blood pressure checked every 3-5 years. If you are 61 years of age or older, have your blood pressure checked every year. You should have your blood pressure measured twice--once when you are at a hospital or clinic, and once when you are not at a hospital or clinic. Record the average of the two measurements. To check your blood pressure when you are not at a hospital or clinic, you can use:  An automated blood pressure machine at a pharmacy.  A home blood pressure monitor.  If you are between 45 years and 39 years old, ask your health care provider if you should take aspirin to prevent strokes.  Have regular diabetes screenings. This involves taking a blood sample to check your fasting blood sugar level.  If you are at a normal weight and have a low risk for diabetes,  have this test once every three years after 39 years of age.  If you are overweight and have a high risk for diabetes, consider being tested at a younger age or more often. PREVENTING INFECTION  Hepatitis B  If you have a higher risk for hepatitis B, you should be screened for this virus. You are considered at high risk for hepatitis B if:  You were born in a country where hepatitis B is common. Ask your health care provider which countries are considered high risk.  Your parents were born in a high-risk country, and you have not been immunized against hepatitis B (hepatitis B vaccine).  You have HIV or AIDS.  You use needles to inject street drugs.  You live with someone who has hepatitis B.  You have had sex with someone who has hepatitis B.  You get hemodialysis treatment.  You take certain medicines for conditions, including cancer, organ transplantation, and autoimmune conditions. Hepatitis C  Blood testing is recommended for:  Everyone born from [birth year] through 1964.  Anyone with known risk factors for hepatitis C. Sexually transmitted infections (STIs)  You should be screened for sexually transmitted infections (STIs) including gonorrhea and chlamydia if:  You are sexually active and are younger than 39 years of age.  You are older than 39 years of age and your health care provider tells you that you are at risk for this type of infection.  Your sexual activity has changed since you were last screened and you are at an increased risk for chlamydia or gonorrhea. Ask your health care provider if you are at risk.  If you do not have HIV, but are at risk, it may be recommended that you take a prescription medicine daily to prevent HIV infection. This is called pre-exposure prophylaxis (PrEP). You are considered at risk if:  You are sexually active and do not regularly use condoms or know the HIV status of your partner(s).  You take drugs by injection.  You are sexually  active with a partner who has HIV. Talk with your health care provider about whether you are at high risk of being infected with HIV. If you choose to begin PrEP, you should first be tested for HIV. You should then be tested every 3 months for as long as you are taking PrEP.  PREGNANCY   If you are premenopausal and you may become pregnant, ask your health care provider about preconception counseling.  If you may  become pregnant, take 400 to 800 micrograms (mcg) of folic acid every day.  If you want to prevent pregnancy, talk to your health care provider about birth control (contraception). OSTEOPOROSIS AND MENOPAUSE   Osteoporosis is a disease in which the bones lose minerals and strength with aging. This can result in serious bone fractures. Your risk for osteoporosis can be identified using a bone density scan.  If you are 28 years of age or older, or if you are at risk for osteoporosis and fractures, ask your health care provider if you should be screened.  Ask your health care provider whether you should take a calcium or vitamin D supplement to lower your risk for osteoporosis.  Menopause may have certain physical symptoms and risks.  Hormone replacement therapy may reduce some of these symptoms and risks. Talk to your health care provider about whether hormone replacement therapy is right for you.  HOME CARE INSTRUCTIONS   Schedule regular health, dental, and eye exams.  Stay current with your immunizations.   Do not use any tobacco products including cigarettes, chewing tobacco, or electronic cigarettes.  If you are pregnant, do not drink alcohol.  If you are breastfeeding, limit how much and how often you drink alcohol.  Limit alcohol intake to no more than 1 drink per day for nonpregnant women. One drink equals 12 ounces of beer, 5 ounces of wine, or 1 ounces of hard liquor.  Do not use street drugs.  Do not share needles.  Ask your health care provider for help if  you need support or information about quitting drugs.  Tell your health care provider if you often feel depressed.  Tell your health care provider if you have ever been abused or do not feel safe at home.   This information is not intended to replace advice given to you by your health care provider. Make sure you discuss any questions you have with your health care provider.   Document Released: 10/15/2010 Document Revised: 04/22/2014 Document Reviewed: 03/03/2013 Elsevier Interactive Patient Education [employer].

## 2015-10-07 LAB — STD SCREEN (8)
HEP A IGM: NEGATIVE
HEP B C IGM: NEGATIVE
HIV Screen 4th Generation wRfx: NONREACTIVE
HSV 1 Glycoprotein G Ab, IgG: 45.4 index — ABNORMAL HIGH (ref 0.00–0.90)
Hepatitis B Surface Ag: NEGATIVE
RPR: NONREACTIVE

## 2015-10-07 LAB — ANEMIA PROFILE B
BASOS ABS: 0 10*3/uL (ref 0.0–0.2)
Basos: 1 %
EOS (ABSOLUTE): 0.3 10*3/uL (ref 0.0–0.4)
Eos: 4 %
FOLATE: 7.6 ng/mL (ref >3.0)
Ferritin: 102 ng/mL (ref 15–150)
Hematocrit: 35.2 % (ref 34.0–46.6)
Hemoglobin: 11.9 g/dL (ref 11.1–15.9)
IMMATURE GRANULOCYTES: 0 %
IRON: 42 ug/dL (ref 27–159)
Immature Grans (Abs): 0 10*3/uL (ref 0.0–0.1)
Iron Saturation: 15 % (ref 15–55)
Lymphocytes Absolute: 1.9 10*3/uL (ref 0.7–3.1)
Lymphs: 30 %
MCH: 27.4 pg (ref 26.6–33.0)
MCHC: 33.8 g/dL (ref 31.5–35.7)
MCV: 81 fL (ref 79–97)
MONOCYTES: 7 %
Monocytes Absolute: 0.5 10*3/uL (ref 0.1–0.9)
NEUTROS PCT: 58 %
Neutrophils Absolute: 3.7 10*3/uL (ref 1.4–7.0)
PLATELETS: 504 10*3/uL — AB (ref 150–379)
RBC: 4.35 x10E6/uL (ref 3.77–5.28)
RDW: 14.2 % (ref 12.3–15.4)
RETIC CT PCT: 1.6 % (ref 0.6–2.6)
TIBC: 284 ug/dL (ref 250–450)
UIBC: 242 ug/dL (ref 131–425)
Vitamin B-12: 215 pg/mL (ref 211–946)
WBC: 6.3 10*3/uL (ref 3.4–10.8)

## 2015-10-07 LAB — CMP14+EGFR
ALBUMIN: 4 g/dL (ref 3.5–5.5)
ALT: 13 IU/L (ref 0–32)
AST: 10 IU/L (ref 0–40)
Albumin/Globulin Ratio: 1.3 (ref 1.2–2.2)
Alkaline Phosphatase: 80 IU/L (ref 39–117)
BUN / CREAT RATIO: 22 (ref 9–23)
BUN: 13 mg/dL (ref 6–20)
CO2: 22 mmol/L (ref 18–29)
CREATININE: 0.6 mg/dL (ref 0.57–1.00)
Calcium: 9.6 mg/dL (ref 8.7–10.2)
Chloride: 97 mmol/L (ref 96–106)
GFR, EST AFRICAN AMERICAN: 134 mL/min/{1.73_m2} (ref >59)
GFR, EST NON AFRICAN AMERICAN: 116 mL/min/{1.73_m2} (ref >59)
GLUCOSE: 195 mg/dL — AB (ref 65–99)
Globulin, Total: 3.2 g/dL (ref 1.5–4.5)
Potassium: 4.5 mmol/L (ref 3.5–5.2)
Sodium: 136 mmol/L (ref 134–144)
TOTAL PROTEIN: 7.2 g/dL (ref 6.0–8.5)

## 2015-10-07 LAB — THYROID PANEL WITH TSH
FREE THYROXINE INDEX: 1.9 (ref 1.2–4.9)
T3 Uptake Ratio: 27 % (ref 24–39)
T4 TOTAL: 7.1 ug/dL (ref 4.5–12.0)
TSH: 1.2 u[IU]/mL (ref 0.450–4.500)

## 2015-10-07 LAB — LIPID PANEL
CHOL/HDL RATIO: 7.2 ratio — AB (ref 0.0–4.4)
Cholesterol, Total: 202 mg/dL — ABNORMAL HIGH (ref 100–199)
HDL: 28 mg/dL — ABNORMAL LOW (ref >39)
LDL CALC: 144 mg/dL — AB (ref 0–99)
Triglycerides: 148 mg/dL (ref 0–149)
VLDL CHOLESTEROL CAL: 30 mg/dL (ref 5–40)

## 2015-10-07 LAB — VITAMIN D 25 HYDROXY (VIT D DEFICIENCY, FRACTURES): VIT D 25 HYDROXY: 11.5 ng/mL — AB (ref 30.0–100.0)

## 2015-10-07 LAB — MICROALBUMIN / CREATININE URINE RATIO
CREATININE, UR: 341.3 mg/dL
MICROALB/CREAT RATIO: 190.2 mg/g{creat} — AB (ref 0.0–30.0)
MICROALBUM., U, RANDOM: 649 ug/mL

## 2015-10-09 ENCOUNTER — Other Ambulatory Visit: Payer: Self-pay | Admitting: Family

## 2015-10-09 MED ORDER — CANAGLIFLOZIN 100 MG PO TABS: 100.0000 mg | ORAL_TABLET | Freq: Every day | ORAL | Status: DC

## 2015-10-09 MED ORDER — METRONIDAZOLE 500 MG PO TABS: 500.0000 mg | ORAL_TABLET | Freq: Two times a day (BID) | ORAL | Status: DC

## 2015-10-09 MED ORDER — VITAMIN D (ERGOCALCIFEROL) 1.25 MG (50000 UNIT) PO CAPS: 50000.0000 [IU] | ORAL_CAPSULE | ORAL | Status: DC

## 2015-10-10 LAB — PAP IG W/ RFLX HPV ASCU: PAP SMEAR COMMENT: 0

## 2015-11-06 ENCOUNTER — Ambulatory Visit: Payer: Self-pay | Admitting: Pharmacist

## 2015-11-14 ENCOUNTER — Encounter: Payer: Self-pay | Admitting: Pharmacist

## 2015-11-14 ENCOUNTER — Ambulatory Visit (INDEPENDENT_AMBULATORY_CARE_PROVIDER_SITE_OTHER): Payer: Medicaid Other | Admitting: Pharmacist

## 2015-11-14 VITALS — BP 112/80 | HR 78 | Ht 63.0 in | Wt 211.0 lb

## 2015-11-14 DIAGNOSIS — Z794 Long term (current) use of insulin: Secondary | ICD-10-CM | POA: Diagnosis not present

## 2015-11-14 DIAGNOSIS — E785 Hyperlipidemia, unspecified: Secondary | ICD-10-CM | POA: Diagnosis not present

## 2015-11-14 DIAGNOSIS — I10 Essential (primary) hypertension: Secondary | ICD-10-CM

## 2015-11-14 DIAGNOSIS — E119 Type 2 diabetes mellitus without complications: Secondary | ICD-10-CM

## 2015-11-14 MED ORDER — INSULIN DEGLUDEC-LIRAGLUTIDE 100-3.6 UNIT-MG/ML ~~LOC~~ SOPN: 16.0000 [IU] | PEN_INJECTOR | Freq: Every day | SUBCUTANEOUS | 0 refills | Status: DC

## 2015-11-14 NOTE — Progress Notes (Addendum)
Patient ID: Kiara Mccall, female   DOB: 1976-01-13, 40 y.o.   MRN: [id number] Subjective:    Kiara Mccall is a 40 y.o. female who presents for follow up of type 2 diabetes mellitus.   She was initially diagnosed with diabetes 2.5 years ago.  It was unclear if she has type 2 or type 1 DM as she has always been prescribed insulin.  C-Peptide from 11/19/2014 was 3.2 (WNL) and GAD antibody was less than 5.0 (WNL)  Indicating she might have endogenous production of insulin.    Current symptoms/problems include :  None - all symptoms have improved.  Known diabetic complications: none Cardiovascular risk factors: diabetes mellitus, dyslipidemia, family history of premature cardiovascular disease, hypertension and obesity (BMI >= 30 kg/m2)   Current diabetic medications include metformin  1 tablet bid, Invokana  qd (patient is not taking because she is concerned about side effect) Lantus 20 units qd, Tanzem  q week (Tanzem will no longer be manufactured after 2018). She also uses Humalog as needed for elevated BG over 200 but has not used in last 3 months  Eye exam current (within one year): no Weight trend: stable Current diet:  Drinking sweet tea daily; breakfast is usually 2 eggs or she skips; lunch is [place], tomato or banana sandwich; supper either hamburger, grilled chicken and green vegetable Current exercise: walking every day  Current monitoring regimen: home blood tests - two times daily Home blood sugar records: patient reports tange of 134 to 212  Any episodes of hypoglycemia? No   Is She on ACE inhibitor or angiotensin II receptor blocker?  Yes  lisinopril (Prinivil)   Patient states she is not taking statin / atorvastatin  The following portions of the patient's history were reviewed and updated as appropriate: allergies, current medications, past family history, past medical history, past social history, past surgical history and problem list.   Objective:     BP 112/80 (BP Location: Left Arm, Patient Position: Sitting, Cuff Size: Normal)   Pulse 78   Ht  (1.6 m)   Wt 211 lb (95.7 kg)   BMI 37.38 kg/m   Lab Review Glucose (mg/dL)  Date Value  24/40/1027 195 (H)  06/20/2015 187 (H)  03/13/2015 181 (H)   CO2 (mmol/L)  Date Value  10/06/2015 22  06/20/2015 23  03/13/2015 21   BUN (mg/dL)  Date Value  25/36/6440 13  06/20/2015 10  03/13/2015 16   Creatinine, Ser (mg/dL)  Date Value  34/74/2595 0.60  06/20/2015 0.56 (L)  03/13/2015 0.47 (L)    Date A1c  10/06/2015 9.2%  03/13/2015 7.8%  11/22/2014 10.7%  10/25/2014 13.3%    ssessment:    Diabetes Mellitus type II, not adequately controlled  HTN - controlled Hyperlipidemia - due to recheck in 1-2 months.  Patient is not compliant with atorvastatin Medication compliance - due to concerns about side effects  Plan:    1.  Rx changes as as follows:  Try Xultophy (tresiba + Victoza) in place of Tanzem and Lantus - start with 16 units daily and increase by 2 units every Monday and Thursday until FBG is between 80 to 130.  Continue metformin  BID  Remain off Invokana for now - in future if needed may try jardiance which patient is more comfortable with  Continue Humalog to use just as needed for elevated BG;  If BG is 200 to 250 = 4 units; 251 to 300 = 5 units and if 301 or above =  6 units.  Restart atorvastatin 2.  Education: Reviewed 'ABCs' of diabetes management (respective goals in parentheses):  A1C (<7), blood pressure (<130/80), and cholesterol (LDL <100). 3.  Reviewed CHO counting diet.  Recommended CHO content of meals be 45 to 60 grams of CHO and for snack 15 grams of CHO or less. Recommended water or unsweetened tea or other no sugar / calorie beverage.  Also advised that tomato or [place] sandwich is OK but banana sandwiches are high In CHO. Reminded serving size of banana is 1/2.  4.  Contiue physical activity - goal of 30 to 40 minutes daily 4 to 5  days per week 5. Continue with current BP meds  6.  F/U in 3 weeks  Henrene Pastor, PharmD, CPP, CDE

## 2015-11-14 NOTE — Patient Instructions (Signed)
Increase non-starchy vegetables - carrots, green bean, squash, zucchini, tomatoes, onions, peppers, spinach and other green leafy vegetables, cabbage, lettuce, cucumbers, asparagus, okra (not fried), eggplant Limit sugar and processed foods (cakes, cookies, ice cream, crackers and chips) Increase fresh fruit but limit serving sizes 1/2 cup or about the size of tennis or baseball Limit red meat to no more than 1-2 times per week (serving size about the size of your palm) Choose whole grains / lean proteins - whole wheat bread, quinoa, whole grain rice (1/2 cup), fish, chicken, [place] Avoid sugar and calorie containing beverages - soda, sweet tea and juice.  Choose water or unsweetened tea instead.  No sweet tea!  Diabetes and Standards of Medical Care   Diabetes is complicated. You may find that your diabetes team includes a dietitian, nurse, diabetes educator, eye doctor, and more. To help everyone know what is going on and to help you get the care you deserve, the following schedule of care was developed to help keep you on track. Below are the tests, exams, vaccines, medicines, education, and plans you will need.  Blood Glucose Goals Prior to meals = 80 - 130 Within 2 hours of the start of a meal = less than 180  HbA1c test (goal is less than 7.0% - your last value was 9.2%) This test shows how well you have controlled your glucose over the past 2 to 3 months. It is used to see if your diabetes management plan needs to be adjusted.   It is performed at least 2 times a year if you are meeting treatment goals.  It is performed 4 times a year if therapy has changed or if you are not meeting treatment goals.  Blood pressure test  This test is performed at every routine medical visit. The goal is less than 140/90 mmHg for most people, but 130/80 mmHg in some cases. Ask your health care provider about your goal.  Dental exam  Follow up with the dentist regularly.  Eye exam  If you are  diagnosed with type 1 diabetes as a child, get an exam upon reaching the age of 77 years or older and have had diabetes for 3 to 5 years. Yearly eye exams are recommended after that initial eye exam.  If you are diagnosed with type 1 diabetes as an adult, get an exam within 5 years of diagnosis and then yearly.  If you are diagnosed with type 2 diabetes, get an exam as soon as possible after the diagnosis and then yearly.  Foot care exam  Visual foot exams are performed at every routine medical visit. The exams check for cuts, injuries, or other problems with the feet.  A comprehensive foot exam should be done yearly. This includes visual inspection as well as assessing foot pulses and testing for loss of sensation.  Check your feet nightly for cuts, injuries, or other problems with your feet. Tell your health care provider if anything is not healing.  Kidney function test (urine microalbumin)  This test is performed once a year.  Type 1 diabetes: The first test is performed 5 years after diagnosis.  Type 2 diabetes: The first test is performed at the time of diagnosis.  A serum creatinine and estimated glomerular filtration rate (eGFR) test is done once a year to assess the level of chronic kidney disease (CKD), if present.  Lipid profile (cholesterol, HDL, LDL, triglycerides)  Performed every 5 years for most people.  The goal for LDL is less  than 100 mg/dL. If you are at high risk, the goal is less than 70 mg/dL.  The goal for HDL is 40 mg/dL to 50 mg/dL for men and 50 mg/dL to 60 mg/dL for women. An HDL cholesterol of 60 mg/dL or higher gives some protection against heart disease.  The goal for triglycerides is less than 150 mg/dL.  Influenza vaccine, pneumococcal vaccine, and hepatitis B vaccine  The influenza vaccine is recommended yearly.  The pneumococcal vaccine is generally given once in a lifetime. However, there are some instances when another vaccination is  recommended. Check with your health care provider.  The hepatitis B vaccine is also recommended for adults with diabetes.  Diabetes self-management education  Education is recommended at diagnosis and ongoing as needed.  Treatment plan  Your treatment plan is reviewed at every medical visit.  Document Released: 01/27/2009 Document Revised: 12/02/2012 Document Reviewed: 09/01/2012 [facility] Patient Information 2014 [place].

## 2015-11-28 ENCOUNTER — Other Ambulatory Visit: Payer: Self-pay | Admitting: Family

## 2015-11-28 DIAGNOSIS — E119 Type 2 diabetes mellitus without complications: Secondary | ICD-10-CM

## 2015-11-28 DIAGNOSIS — I10 Essential (primary) hypertension: Secondary | ICD-10-CM

## 2015-11-28 DIAGNOSIS — Z794 Long term (current) use of insulin: Principal | ICD-10-CM

## 2015-12-14 ENCOUNTER — Ambulatory Visit: Payer: Self-pay | Admitting: Pharmacist

## 2015-12-19 ENCOUNTER — Encounter: Payer: Self-pay | Admitting: Family

## 2016-01-10 ENCOUNTER — Encounter: Payer: Self-pay | Admitting: Family

## 2016-01-10 ENCOUNTER — Ambulatory Visit (INDEPENDENT_AMBULATORY_CARE_PROVIDER_SITE_OTHER): Payer: Medicaid Other | Admitting: Family

## 2016-01-10 VITALS — BP 132/90 | HR 92 | Temp 97.9°F | Ht 63.0 in | Wt 212.2 lb

## 2016-01-10 DIAGNOSIS — F418 Other specified anxiety disorders: Secondary | ICD-10-CM

## 2016-01-10 DIAGNOSIS — E669 Obesity, unspecified: Secondary | ICD-10-CM | POA: Diagnosis not present

## 2016-01-10 DIAGNOSIS — E785 Hyperlipidemia, unspecified: Secondary | ICD-10-CM | POA: Diagnosis not present

## 2016-01-10 DIAGNOSIS — F329 Major depressive disorder, single episode, unspecified: Secondary | ICD-10-CM

## 2016-01-10 DIAGNOSIS — R000 Tachycardia, unspecified: Secondary | ICD-10-CM

## 2016-01-10 DIAGNOSIS — E559 Vitamin D deficiency, unspecified: Secondary | ICD-10-CM | POA: Diagnosis not present

## 2016-01-10 DIAGNOSIS — E119 Type 2 diabetes mellitus without complications: Secondary | ICD-10-CM | POA: Diagnosis not present

## 2016-01-10 DIAGNOSIS — W57XXXA Bitten or stung by nonvenomous insect and other nonvenomous arthropods, initial encounter: Secondary | ICD-10-CM

## 2016-01-10 DIAGNOSIS — F419 Anxiety disorder, unspecified: Secondary | ICD-10-CM

## 2016-01-10 DIAGNOSIS — Z23 Encounter for immunization: Secondary | ICD-10-CM | POA: Diagnosis not present

## 2016-01-10 DIAGNOSIS — Z794 Long term (current) use of insulin: Secondary | ICD-10-CM

## 2016-01-10 DIAGNOSIS — F32A Depression, unspecified: Secondary | ICD-10-CM

## 2016-01-10 DIAGNOSIS — I10 Essential (primary) hypertension: Secondary | ICD-10-CM | POA: Diagnosis not present

## 2016-01-10 LAB — BAYER DCA HB A1C WAIVED: HB A1C: 10.5 % — AB (ref <7.0)

## 2016-01-10 MED ORDER — INSULIN DEGLUDEC-LIRAGLUTIDE 100-3.6 UNIT-MG/ML ~~LOC~~ SOPN: 16.0000 [IU] | PEN_INJECTOR | Freq: Every day | SUBCUTANEOUS | 0 refills | Status: DC

## 2016-01-10 MED ORDER — FLUOXETINE HCL 40 MG PO CAPS: 40.0000 mg | ORAL_CAPSULE | Freq: Every day | ORAL | 3 refills | Status: DC

## 2016-01-10 MED ORDER — TRIAMCINOLONE ACETONIDE 0.5 % EX OINT: 1.0000 "application " | TOPICAL_OINTMENT | Freq: Two times a day (BID) | CUTANEOUS | 0 refills | Status: DC

## 2016-01-10 NOTE — Progress Notes (Signed)
Subjective:    Patient ID: Kiara Mccall, female    DOB: 1976/01/12, 40 y.o.   MRN: [id number]  Pt presents to the office today for chronic follow up. Pt states her brother died in 2022-12-02 at age 43. PT states she has been very withdrawn since then and can not sleep and just "eats". Pt states her blood sugars have been in the 300's.  Pt has appt with clinical pharm on 11/14/15, but has not started any of her recommendations.  Diabetes  She presents for her follow-up diabetic visit. She has type 2 diabetes mellitus. Her disease course has been fluctuating. Hypoglycemia symptoms include nervousness/anxiousness. Pertinent negatives for hypoglycemia include no confusion or headaches. Pertinent negatives for diabetes include no blurred vision, no fatigue, no foot paresthesias, no foot ulcerations and no visual change. There are no hypoglycemic complications. Symptoms are stable. Pertinent negatives for diabetic complications include no CVA, heart disease, nephropathy or peripheral neuropathy. Current diabetic treatment includes insulin injections and oral agent (monotherapy). She is compliant with treatment all of the time. She is following a generally unhealthy diet. She rarely participates in exercise. Her breakfast blood glucose range is generally >200 mg/dl. An ACE inhibitor/angiotensin II receptor blocker is being taken. Eye exam is not current.  Hypertension  This is a chronic problem. The current episode started more than 1 year ago. The problem has been resolved since onset. The problem is controlled. Pertinent negatives include no anxiety, blurred vision, headaches, palpitations, peripheral edema or shortness of breath. Risk factors for coronary artery disease include dyslipidemia, obesity, post-menopausal state, sedentary lifestyle and family history. Past treatments include ACE inhibitors and beta blockers. There is no history of kidney disease, CAD/MI, CVA or heart failure.  Hyperlipidemia  This  is a chronic problem. The current episode started more than 1 year ago. The problem is uncontrolled. Recent lipid tests were reviewed and are high. Exacerbating diseases include obesity. Pertinent negatives include no shortness of breath. Current antihyperlipidemic treatment includes statins (Pt states over the last few months she has not take medication). The current treatment provides no improvement of lipids. Risk factors for coronary artery disease include dyslipidemia, diabetes mellitus, family history, hypertension, obesity and post-menopausal.  Depression       The patient presents with depression.  This is a chronic problem.  The current episode started more than 1 year ago.   The onset quality is gradual.   The problem occurs intermittently.  The problem has been resolved since onset.  Associated symptoms include restlessness, decreased interest and sad.  Associated symptoms include no fatigue, does not have insomnia, not irritable and no headaches.  Past treatments include SSRIs - Selective serotonin reuptake inhibitors.  Past medical history includes depression.     Pertinent negatives include no anxiety. Anxiety  Presents for follow-up visit. Onset was more than 5 years ago. The problem has been resolved. Symptoms include depressed mood, excessive worry, nervous/anxious behavior and restlessness. Patient reports no confusion, insomnia, malaise, palpitations or shortness of breath. Symptoms occur occasionally. The symptoms are aggravated by family issues. The quality of sleep is good.   Her past medical history is significant for anxiety/panic attacks and depression. Past treatments include SSRIs. The treatment provided moderate relief. Compliance with prior treatments has been good.      Review of Systems  Constitutional: Negative.  Negative for fatigue.  HENT: Negative.   Eyes: Negative.  Negative for blurred vision.  Respiratory: Negative.  Negative for shortness of breath.  Cardiovascular: Negative.  Negative for palpitations.  Gastrointestinal: Negative.   Endocrine: Negative.   Genitourinary: Negative.   Musculoskeletal: Negative.   Neurological: Negative.  Negative for headaches.  Hematological: Negative.   Psychiatric/Behavioral: Positive for depression. Negative for confusion. The patient is nervous/anxious. The patient does not have insomnia.   All other systems reviewed and are negative.      Objective:   Physical Exam  Constitutional: She is oriented to person, place, and time. She appears well-developed and well-nourished. She is not irritable. No distress.  HENT:  Head: Normocephalic and atraumatic.  Right Ear: External ear normal.  Left Ear: External ear normal.  Nose: Nose normal.  Mouth/Throat: Oropharynx is clear and moist.  Eyes: Pupils are equal, round, and reactive to light.  Neck: Normal range of motion. Neck supple. No thyromegaly present.  Cardiovascular: Normal rate, regular rhythm, normal heart sounds and intact distal pulses.   No murmur heard. Pulmonary/Chest: Effort normal and breath sounds normal. No respiratory distress. She has no wheezes.  Abdominal: Soft. Bowel sounds are normal. She exhibits no distension. There is no tenderness.  Musculoskeletal: Normal range of motion. She exhibits no edema or tenderness.  Neurological: She is alert and oriented to person, place, and time. She has normal reflexes. No cranial nerve deficit.  Skin: Skin is warm and dry. Rash noted.  Generalized lesions on bilateral arms, neck, back, abdomen, and legs   Psychiatric: She has a normal mood and affect. Her behavior is normal. Judgment and thought content normal.  Vitals reviewed.    BP 132/90   Pulse 92   Temp 97.9 F (36.6 C) (Oral)   Ht 5' 3"  (1.6 m)   Wt 212 lb 3.2 oz (96.3 kg)   BMI 37.59 kg/m       Assessment & Plan:  1. Essential hypertension - CMP14+EGFR  2. Diabetes mellitus type 2, insulin dependent ([place]) -Since  pt has not started clinical pharms recommendations we will try to start today Try Xultophy (tresiba + Victoza) in place of Tanzem and Lantus - start with 16 units daily and increase by 2 units every Monday and Thursday until FBG is between 80 to 130.             Continue metformin 1075m BID             Remain off Invokana for now - in future if needed may try jardiance which patient is more comfortable with             Continue Humalog to use just as needed for elevated BG;  If BG is 200 to 250 = 4 units; 251 to 300 = 5 units and if 301 or above = 6 units.             Restart atorvastatin - CMP14+EGFR - Bayer DCA Hb A1c Waived - Ambulatory referral to Ophthalmology  3. Anxiety and depression -Prozac increased to 40 mg today - CMP14+EGFR - FLUoxetine (PROZAC) 40 MG capsule; Take 1 capsule (40 mg total) by mouth daily.  Dispense: 90 capsule; Refill: 3  4. Hyperlipidemia - CMP14+EGFR - Lipid panel  5. Obesity (BMI 30-39.9) - CMP14+EGFR  6. Tachycardia - CMP14+EGFR  7. Vitamin D deficiency - CMP14+EGFR - VITAMIN D 25 Hydroxy (Vit-D Deficiency, Fractures)  8. Bedbug bite -PT has exterminator coming to home this week -Long discussion about putting all pillows in bags and washing clothing and liens in hot water - triamcinolone ointment (KENALOG) 0.5 %; Apply 1 application topically  2 (two) times daily.  Dispense: 30 g; Refill: 0   Continue all meds Labs pending Health Maintenance reviewed Diet and exercise encouraged RTO 1 month and keep follow up appt with Clinical Pharm  Evelina Dun, FNP

## 2016-01-10 NOTE — Patient Instructions (Addendum)
Try Tula Nakayama (tresiba + Victoza) in place of Tanzem and Lantus - start with 16 units daily and increase by 2 units every Monday and Thursday until FBG is between 80 to 130.             Continue metformin 1000mg  BID             Remain off Invokana for now - in future if needed may try jardiance which patient is more comfortable with             Continue Humalog to use just as needed for elevated BG;  If BG is 200 to 250 = 4 units; 251 to 300 = 5 units and if 301 or above = 6 units.             Restart atorvastatin     Bedbugs Bedbugs are tiny bugs that live in and around beds. They stay hidden during the day, and they come out at night and bite. Bedbugs need blood to live and grow. WHERE ARE BEDBUGS FOUND? Bedbugs can be found anywhere, whether a place is clean or dirty. They are most often found in places where many people come and go, such as hotels, shelters, dorms, and health care settings. It is also common for them to be found in homes where there are many birds or bats nearby. WHAT ARE BEDBUG BITES LIKE? A bedbug bite leaves a small red bump with a darker red dot in the middle. The bump may appear soon after a person is bitten or a day or more later. Bedbug bites usually do not hurt, but they may itch. Most people do not need treatment for bedbug bites. The bumps usually go away on their own in a few days. HOW DO I CHECK FOR BEDBUGS? Bedbugs are reddish-brown, oval, and flat. They range in size from 1 mm to 7 mm and they cannot fly. Look for bedbugs in these places:  On mattresses, bed frames, headboards, and box springs.  On drapes and curtains in bedrooms.  Under carpeting in bedrooms.  Behind electrical outlets.  Behind any wallpaper that is peeling.  Inside luggage. Also look for black or red spots or stains on or near the bed. Stains can come from bedbugs that have been crushed or from bedbug waste. WHAT SHOULD I DO IF I FIND BEDBUGS? When Traveling If you find bedbugs  while traveling, check all of your possessions carefully before you bring them into your home. Consider throwing away anything that has bedbugs on it. At Home If you find bedbugs at home, your bedroom may need to be treated by a pest control expert. You may also need to throw away mattresses or luggage. To help keep bedbugs from coming back, consider taking these actions:  Put a plastic cover over your mattress.  Wash your clothes and bedding in water that is hotter than 120F (48.9C) and dry them on a hot setting. Bedbugs are killed by high temperatures.  Vacuum often around the bed and in all of the cracks and crevices where the bugs might hide.  Carefully check all used furniture, bedding, or clothes that you bring into your home.  Eliminate bird nests and bat roosts that are near your home. In Your Bed If you find bedbugs in your bed, consider wearing pajamas that have long sleeves and pant legs. Bedbugs usually bite areas of the skin that are not covered.   This information is not intended to replace advice given to you  by your health care provider. Make sure you discuss any questions you have with your health care provider.   Document Released: 05/04/2010 Document Revised: 08/16/2014 Document Reviewed: 03/28/2014 Elsevier Interactive Patient Education [employer]2016 [employer] Inc.

## 2016-01-11 ENCOUNTER — Telehealth: Payer: Self-pay

## 2016-01-11 LAB — CMP14+EGFR
ALK PHOS: 63 IU/L (ref 39–117)
ALT: 12 IU/L (ref 0–32)
AST: 11 IU/L (ref 0–40)
Albumin/Globulin Ratio: 1.3 (ref 1.2–2.2)
Albumin: 4.1 g/dL (ref 3.5–5.5)
BILIRUBIN TOTAL: 0.3 mg/dL (ref 0.0–1.2)
BUN/Creatinine Ratio: 14 (ref 9–23)
BUN: 9 mg/dL (ref 6–20)
CHLORIDE: 94 mmol/L — AB (ref 96–106)
CO2: 22 mmol/L (ref 18–29)
Calcium: 10.2 mg/dL (ref 8.7–10.2)
Creatinine, Ser: 0.64 mg/dL (ref 0.57–1.00)
GFR calc Af Amer: 131 mL/min/{1.73_m2} (ref >59)
GFR calc non Af Amer: 114 mL/min/{1.73_m2} (ref >59)
GLUCOSE: 273 mg/dL — AB (ref 65–99)
Globulin, Total: 3.1 g/dL (ref 1.5–4.5)
POTASSIUM: 5.2 mmol/L (ref 3.5–5.2)
Sodium: 133 mmol/L — ABNORMAL LOW (ref 134–144)
TOTAL PROTEIN: 7.2 g/dL (ref 6.0–8.5)

## 2016-01-11 LAB — VITAMIN D 25 HYDROXY (VIT D DEFICIENCY, FRACTURES): Vit D, 25-Hydroxy: 14.2 ng/mL — ABNORMAL LOW (ref 30.0–100.0)

## 2016-01-11 LAB — LIPID PANEL
CHOLESTEROL TOTAL: 214 mg/dL — AB (ref 100–199)
Chol/HDL Ratio: 6.3 ratio units — ABNORMAL HIGH (ref 0.0–4.4)
HDL: 34 mg/dL — AB (ref >39)
LDL Calculated: 144 mg/dL — ABNORMAL HIGH (ref 0–99)
TRIGLYCERIDES: 180 mg/dL — AB (ref 0–149)
VLDL CHOLESTEROL CAL: 36 mg/dL (ref 5–40)

## 2016-01-12 ENCOUNTER — Other Ambulatory Visit: Payer: Self-pay | Admitting: Family

## 2016-01-12 MED ORDER — INSULIN GLARGINE 100 UNIT/ML ~~LOC~~ SOLN: 10.0000 [IU] | Freq: Every day | SUBCUTANEOUS | 11 refills | Status: DC

## 2016-01-12 MED ORDER — EXENATIDE ER 2 MG ~~LOC~~ SRER: 2.0000 mg | SUBCUTANEOUS | 1 refills | Status: DC

## 2016-01-12 NOTE — Telephone Encounter (Signed)
Medication changed. Bydureon and lantus Prescription sent to pharmacy

## 2016-01-15 ENCOUNTER — Other Ambulatory Visit: Payer: Self-pay | Admitting: Family

## 2016-01-15 ENCOUNTER — Telehealth: Payer: Self-pay | Admitting: Family

## 2016-01-15 NOTE — Telephone Encounter (Signed)
Patient aware of results and recommendation.

## 2016-01-23 ENCOUNTER — Ambulatory Visit: Payer: Medicaid Other | Admitting: Pharmacist

## 2016-01-30 ENCOUNTER — Ambulatory Visit (INDEPENDENT_AMBULATORY_CARE_PROVIDER_SITE_OTHER): Payer: Medicaid Other | Admitting: Pharmacist

## 2016-01-30 ENCOUNTER — Encounter: Payer: Self-pay | Admitting: Pharmacist

## 2016-01-30 VITALS — BP 122/84 | HR 84 | Ht 63.0 in | Wt 211.0 lb

## 2016-01-30 DIAGNOSIS — E669 Obesity, unspecified: Secondary | ICD-10-CM

## 2016-01-30 DIAGNOSIS — E119 Type 2 diabetes mellitus without complications: Secondary | ICD-10-CM | POA: Diagnosis not present

## 2016-01-30 DIAGNOSIS — Z794 Long term (current) use of insulin: Secondary | ICD-10-CM | POA: Diagnosis not present

## 2016-01-30 DIAGNOSIS — I10 Essential (primary) hypertension: Secondary | ICD-10-CM | POA: Diagnosis not present

## 2016-01-30 DIAGNOSIS — Z79899 Other long term (current) drug therapy: Secondary | ICD-10-CM | POA: Diagnosis not present

## 2016-01-30 NOTE — Patient Instructions (Signed)
Continue to avoid sodas and limit bread and other high carbohydrate foods.   Increase Xultophy to 20 units once a day on Thursday, October 19th.   Continue to walk daily - great job!  Goal Blood glucose:    Fasting (before meals) = 80 to 130   Within 2 hours of eating = less than 180   Fruit:   1/2 cup or once piece (baseball size)- apples, pears, pineapple, peaches, oranges  1 cup - berries, melons  1/2 banana or grapefruit  Stachy Vegetables:   1/2 cup potatoes (white or sweet), corn, peas  Other starches:   1 piece of bread  1/2 cup rice or pasta  4 inch pancake    These foods you can eat more freely: Proteins:   Fish  Chicken or [place]urkey  Beef or pork (1 or 2 servings per week)  Eggs  Nuts (peanuts, walnuts, almonds, pistachios)  Cheese  Non starchy vegetables:  Green beans  Broccoli or cauliflower  Lettuce, greens, cabbage  Brussel Sprout  Carrots  Onions and peppers  Celery  Tomatoes  Asparagus  Eggplant  Cucumbers  Squash and Zucchini

## 2016-01-30 NOTE — Progress Notes (Signed)
Patient ID: Delories Mauri, female   DOB: 23-May-1975, 40 y.o.   MRN: [id number] Subjective:    Kameren Baade is a 40 y.o. female who presents for follow up of type 2 diabetes mellitus.   She was initially diagnosed with diabetes 2.5 years ago.  It was unclear if she has type 2 or type 1 DM as she has always been prescribed insulin.  C-Peptide from 11/19/2014 was 3.2 (WNL) and GAD antibody was less than 5.0 (WNL).  Indicating she might have endogenous production of insulin.   Mrs. Broadnan saw her PCP about 1 month ago and it was noted that A1c had increased.  Ms. Munroe admits that she has stopped several medications - insulin and statin because she has lost her brother to complications of diabetes and was frustrated.  She has since started [facility] 01/14/2016.   Current symptoms/problems include :  None - she notes tha energy level has improved and polyuria improved  Known diabetic complications: none Cardiovascular risk factors: diabetes mellitus, dyslipidemia, family history of premature cardiovascular disease, hypertension and obesity (BMI >= 30 kg/m2)   Current diabetic medications include metformin 1000mg  1 tablet bid, Xultophy 18 units once daily  Eye exam current (within one year): no Weight trend: stable Current diet:  She has stopped all sweetened beverages - tea and soda since our last visit and has been decreasing intake of bread and other high CHO foods.  She feels the Xultophy has decreased her appetite some and she has been eating smaller portions.  Current exercise: walking every day  Current monitoring regimen: home blood tests - two times daily Home blood sugar records: ranges from 102 to 215 7 day average = 147 (n=11) 14 day average = 167 (n=23) 30 day average = 211 (n=48) 90 day average = 224 (n=99)  Any episodes of hypoglycemia? No   Is She on ACE inhibitor or angiotensin II receptor blocker?  Yes  lisinopril (Prinivil)   Patient states she is not taking  statin / atorvastatin  The following portions of the patient's history were reviewed and updated as appropriate: allergies, current medications, past family history, past medical history, past social history, past surgical history and problem list.   Objective:    BP 122/84   Pulse 84   Ht 5\' 3"  (1.6 m)   Wt 211 lb (95.7 kg)   BMI 37.38 kg/m   Lab Review Glucose (mg/dL)  Date Value  40/98/1191 273 (H)  10/06/2015 195 (H)  06/20/2015 187 (H)   CO2 (mmol/L)  Date Value  01/10/2016 22  10/06/2015 22  06/20/2015 23   BUN (mg/dL)  Date Value  47/82/9562 9  10/06/2015 13  06/20/2015 10   Creatinine, Ser (mg/dL)  Date Value  13/11/6576 0.64  10/06/2015 0.60  06/20/2015 0.56 (L)    Date A1c  01/10/2016 10.5% (has stopped meds for several weeks)  10/06/2015 9.2%  03/13/2015 7.8%  11/22/2014 10.7%  10/25/2014 13.3%    ssessment:    Diabetes Mellitus type II, not adequately controlled but improving per home BG readings  HTN - controlled Hyperlipidemia - Patient was not compliant with atorvastatin at last lipids check but has been taking regularly over the last 2 weeks. Medication compliance - was sporadic over the last 2 months but has been taking all meds as prescribed the last 2 weeks. Obesity - weight decresaed by 1 lb.    Plan:    1.  Rx changes as as follows:  Continue Xultophy (tresiba + Victoza)  increase to 20 units daily. - should help with DM and weight  Continue metformin 1000mg  BID 2.  Education: Reviewed 'ABCs' of diabetes management (respective goals in parentheses):  A1C (<7), blood pressure (<130/80), and cholesterol (LDL <100). 3.  Reviewed CHO counting diet.  Recommended CHO content of meals be 45 to 60 grams of CHO and for snack 15 grams of CHO or less. Recommended water or unsweetened tea or other no sugar / calorie beverage.  Also advised that tomato or Malaw[place]urkey sandwich is OK but banana sandwiches are high In CHO. Reminded serving size of banana  is 1/2.  4.  Contiue physical activity - goal of 30 to 40 minutes daily 4 to 5 days per week 5. Appt made for patient to have diabetic eye exam - Dr Mayford Knifeurner 02/01/16 at 10:15am 6.  F/U in 2 weeks with PCP and 3 months with me.  Henrene Pastorammy Mayling Aber, PharmD, CPP, CDE  Patient ID: Rosalio MacadamiaMarcia Retana, female   DOB: 05-Jun-1975, 40 y.o.   MRN: [id number]

## 2016-02-13 ENCOUNTER — Ambulatory Visit: Payer: Medicaid Other | Admitting: Family

## 2016-02-22 ENCOUNTER — Ambulatory Visit (INDEPENDENT_AMBULATORY_CARE_PROVIDER_SITE_OTHER): Payer: Medicaid Other | Admitting: Family

## 2016-02-22 ENCOUNTER — Encounter: Payer: Self-pay | Admitting: Family

## 2016-02-22 VITALS — BP 126/88 | HR 105 | Temp 99.3°F | Ht 63.0 in | Wt 211.4 lb

## 2016-02-22 DIAGNOSIS — Z794 Long term (current) use of insulin: Secondary | ICD-10-CM

## 2016-02-22 DIAGNOSIS — F418 Other specified anxiety disorders: Secondary | ICD-10-CM

## 2016-02-22 DIAGNOSIS — I10 Essential (primary) hypertension: Secondary | ICD-10-CM

## 2016-02-22 DIAGNOSIS — F329 Major depressive disorder, single episode, unspecified: Secondary | ICD-10-CM

## 2016-02-22 DIAGNOSIS — E119 Type 2 diabetes mellitus without complications: Secondary | ICD-10-CM | POA: Diagnosis not present

## 2016-02-22 DIAGNOSIS — F32A Depression, unspecified: Secondary | ICD-10-CM

## 2016-02-22 DIAGNOSIS — F419 Anxiety disorder, unspecified: Secondary | ICD-10-CM

## 2016-02-22 LAB — BAYER DCA HB A1C WAIVED: HB A1C (BAYER DCA - WAIVED): 8.5 % — ABNORMAL HIGH (ref <7.0)

## 2016-02-22 MED ORDER — BUPROPION HCL ER (XL) 150 MG PO TB24
150.0000 mg | ORAL_TABLET | Freq: Every day | ORAL | 1 refills | Status: DC
Start: 2016-02-22 — End: 2016-03-26

## 2016-02-22 NOTE — Progress Notes (Signed)
Subjective:    Patient ID: Kiara Mccall, female    DOB: Mar 18, 1976, 40 y.o.   MRN: [id number]  Pt presents to the office today for chronic follow up. PT has followed up with Tammy, [facility], 01/30/16. Pt reports taking all of her medications and doing better, but does have increase in her anxiety.  Hypertension  This is a chronic problem. The current episode started more than 1 year ago. The problem has been resolved since onset. The problem is controlled. Associated symptoms include anxiety and headaches. Pertinent negatives include no blurred vision, palpitations, peripheral edema or shortness of breath. Risk factors for coronary artery disease include dyslipidemia, obesity, post-menopausal state, sedentary lifestyle and family history. Past treatments include ACE inhibitors and beta blockers. There is no history of kidney disease, CAD/MI, CVA or heart failure.  Diabetes  She presents for her follow-up diabetic visit. She has type 2 diabetes mellitus. Her disease course has been fluctuating. Hypoglycemia symptoms include headaches and nervousness/anxiousness. Pertinent negatives for hypoglycemia include no confusion. Pertinent negatives for diabetes include no blurred vision, no fatigue, no foot paresthesias, no foot ulcerations and no visual change. There are no hypoglycemic complications. Symptoms are stable. Pertinent negatives for diabetic complications include no CVA, heart disease, nephropathy or peripheral neuropathy. Current diabetic treatment includes insulin injections and oral agent (monotherapy). She is compliant with treatment all of the time. She is following a generally unhealthy diet. She rarely participates in exercise. Her breakfast blood glucose range is generally 130-140 mg/dl. An ACE inhibitor/angiotensin II receptor blocker is being taken. Eye exam is not current.  Hyperlipidemia  This is a chronic problem. The current episode started more than 1 year ago. The  problem is uncontrolled. Recent lipid tests were reviewed and are high. Exacerbating diseases include obesity. Pertinent negatives include no shortness of breath. Current antihyperlipidemic treatment includes statins (Pt states over the last few months she has not take medication). The current treatment provides no improvement of lipids. Risk factors for coronary artery disease include dyslipidemia, diabetes mellitus, family history, hypertension, obesity and post-menopausal.  Depression       The patient presents with depression.  This is a chronic problem.  The current episode started more than 1 year ago.   The onset quality is gradual.   The problem occurs intermittently.  The problem has been resolved since onset.  Associated symptoms include decreased concentration, restlessness, decreased interest, headaches and sad.  Associated symptoms include no fatigue, does not have insomnia and not irritable.  Past treatments include SSRIs - Selective serotonin reuptake inhibitors.  Past medical history includes anxiety and depression.   Anxiety  Presents for follow-up visit. Onset was more than 5 years ago. The problem has been resolved. Symptoms include decreased concentration, depressed mood, excessive worry, nervous/anxious behavior, panic and restlessness. Patient reports no confusion, insomnia, malaise, palpitations or shortness of breath. Symptoms occur constantly. The symptoms are aggravated by family issues. The quality of sleep is good.   Her past medical history is significant for anxiety/panic attacks and depression. Past treatments include SSRIs. The treatment provided moderate relief. Compliance with prior treatments has been good.      Review of Systems  Constitutional: Negative.  Negative for fatigue.  HENT: Negative.   Eyes: Negative.  Negative for blurred vision.  Respiratory: Negative.  Negative for shortness of breath.   Cardiovascular: Negative.  Negative for palpitations.    Gastrointestinal: Negative.   Endocrine: Negative.   Genitourinary: Negative.   Musculoskeletal: Negative.   Neurological:  Positive for headaches.  Hematological: Negative.   Psychiatric/Behavioral: Positive for decreased concentration and depression. Negative for confusion. The patient is nervous/anxious. The patient does not have insomnia.   All other systems reviewed and are negative.      Objective:   Physical Exam  Constitutional: She is oriented to person, place, and time. She appears well-developed and well-nourished. She is not irritable. No distress.  HENT:  Head: Normocephalic and atraumatic.  Right Ear: External ear normal.  Left Ear: External ear normal.  Nose: Nose normal.  Mouth/Throat: Oropharynx is clear and moist.  Eyes: Pupils are equal, round, and reactive to light.  Neck: Normal range of motion. Neck supple. No thyromegaly present.  Cardiovascular: Normal rate, regular rhythm, normal heart sounds and intact distal pulses.   No murmur heard. Pulmonary/Chest: Effort normal and breath sounds normal. No respiratory distress. She has no wheezes.  Abdominal: Soft. Bowel sounds are normal. She exhibits no distension. There is no tenderness.  Musculoskeletal: Normal range of motion. She exhibits no edema or tenderness.  Neurological: She is alert and oriented to person, place, and time.  Skin: Skin is warm and dry.  Generalized scars on bilateral arms, neck, back, abdomen, and legs   Psychiatric: She has a normal mood and affect. Her behavior is normal. Judgment and thought content normal.  Vitals reviewed.    BP 126/88   Pulse (!) 105   Temp 99.3 F (37.4 C) (Oral)   Ht _0  (1.6 m)   Wt 211 lb 6.4 oz (95.9 kg)   BMI 37.45 kg/m       Assessment & Plan:  1. Essential hypertension Continue medications - CMP14+EGFR  2. Diabetes mellitus type 2, insulin dependent ([facility]) -Low carb diet, continue medications - Bayer DCA Hb A1c Waived - CMP14+EGFR  3.  Anxiety and depression -Wellbutrin started today, pt to continue prozac 40 mg -Stress management discussed - buPROPion (WELLBUTRIN XL) 150 MG 24 hr tablet; Take 1 tablet (150 mg total) by mouth daily.  Dispense: 90 tablet; Refill: 1 - CMP14+EGFR   Continue all meds Labs pending Health Maintenance reviewed Diet and exercise encouraged RTO 1 months to discuss GAD  Evelina Dun, FNP

## 2016-02-22 NOTE — Patient Instructions (Signed)

## 2016-02-23 LAB — CMP14+EGFR
ALBUMIN: 3.4 g/dL — AB (ref 3.5–5.5)
ALK PHOS: 65 IU/L (ref 39–117)
ALT: 10 IU/L (ref 0–32)
AST: 12 IU/L (ref 0–40)
Albumin/Globulin Ratio: 1.3 (ref 1.2–2.2)
BUN / CREAT RATIO: 27 — AB (ref 9–23)
BUN: 11 mg/dL (ref 6–20)
Bilirubin Total: <0.2 mg/dL (ref 0.0–1.2)
CO2: 22 mmol/L (ref 18–29)
CREATININE: 0.41 mg/dL — AB (ref 0.57–1.00)
Calcium: 8.7 mg/dL (ref 8.7–10.2)
Chloride: 103 mmol/L (ref 96–106)
GFR calc Af Amer: 152 mL/min/{1.73_m2} (ref >59)
GFR calc non Af Amer: 131 mL/min/{1.73_m2} (ref >59)
GLUCOSE: 129 mg/dL — AB (ref 65–99)
Globulin, Total: 2.7 g/dL (ref 1.5–4.5)
Potassium: 4.4 mmol/L (ref 3.5–5.2)
Sodium: 141 mmol/L (ref 134–144)
TOTAL PROTEIN: 6.1 g/dL (ref 6.0–8.5)

## 2016-03-26 ENCOUNTER — Ambulatory Visit (INDEPENDENT_AMBULATORY_CARE_PROVIDER_SITE_OTHER): Payer: Medicaid Other | Admitting: Family

## 2016-03-26 VITALS — BP 127/89 | HR 91 | Temp 98.8°F | Ht 63.0 in | Wt 212.6 lb

## 2016-03-26 DIAGNOSIS — F418 Other specified anxiety disorders: Secondary | ICD-10-CM

## 2016-03-26 DIAGNOSIS — F32A Depression, unspecified: Secondary | ICD-10-CM

## 2016-03-26 DIAGNOSIS — F419 Anxiety disorder, unspecified: Principal | ICD-10-CM

## 2016-03-26 DIAGNOSIS — F329 Major depressive disorder, single episode, unspecified: Secondary | ICD-10-CM

## 2016-03-26 NOTE — Patient Instructions (Signed)
Generalized Anxiety Disorder Generalized anxiety disorder (GAD) is a mental disorder. It interferes with life functions, including relationships, work, and school. GAD is different from normal anxiety, which everyone experiences at some point in their lives in response to specific life events and activities. Normal anxiety actually helps us prepare for and get through these life events and activities. Normal anxiety goes away after the event or activity is over.  GAD causes anxiety that is not necessarily related to specific events or activities. It also causes excess anxiety in proportion to specific events or activities. The anxiety associated with GAD is also difficult to control. GAD can vary from mild to severe. People with severe GAD can have intense waves of anxiety with physical symptoms (panic attacks).  SYMPTOMS The anxiety and worry associated with GAD are difficult to control. This anxiety and worry are related to many life events and activities and also occur more days than not for 6 months or longer. People with GAD also have three or more of the following symptoms (one or more in children):  Restlessness.   Fatigue.  Difficulty concentrating.   Irritability.  Muscle tension.  Difficulty sleeping or unsatisfying sleep. DIAGNOSIS GAD is diagnosed through an assessment by your health care provider. Your health care provider will ask you questions aboutyour mood,physical symptoms, and events in your life. Your health care provider may ask you about your medical history and use of alcohol or drugs, including prescription medicines. Your health care provider may also do a physical exam and blood tests. Certain medical conditions and the use of certain substances can cause symptoms similar to those associated with GAD. Your health care provider may refer you to a mental health specialist for further evaluation. TREATMENT The following therapies are usually used to treat GAD:    Medication. Antidepressant medication usually is prescribed for long-term daily control. Antianxiety medicines may be added in severe cases, especially when panic attacks occur.   Talk therapy (psychotherapy). Certain types of talk therapy can be helpful in treating GAD by providing support, education, and guidance. A form of talk therapy called cognitive behavioral therapy can teach you healthy ways to think about and react to daily life events and activities.  Stress managementtechniques. These include yoga, meditation, and exercise and can be very helpful when they are practiced regularly. A mental health specialist can help determine which treatment is best for you. Some people see improvement with one therapy. However, other people require a combination of therapies. This information is not intended to replace advice given to you by your health care provider. Make sure you discuss any questions you have with your health care provider. Document Released: 07/27/2012 Document Revised: 04/22/2014 Document Reviewed: 07/27/2012 Elsevier Interactive Patient Education  2017 [employer] Inc.  

## 2016-03-26 NOTE — Progress Notes (Signed)
   Subjective:    Patient ID: Kiara Mccall, female    DOB: 1976-01-07, 40 y.o.   MRN: [id number]  Pt presents to the office today to recheck GAD. PT was seen in the office on 02/22/16 and started on Wellbutrin 150 mg and told to continue Prozac 40 mg daily. PT states she never started the Wellbutrin, but her GAD is improved. She does not feel like she needs it at this time.  Anxiety  Presents for follow-up visit. Patient reports no decreased concentration, dry mouth, excessive worry, irritability, nervous/anxious behavior or palpitations. Symptoms occur occasionally. The quality of sleep is fair.        Review of Systems  Constitutional: Negative for irritability.  Cardiovascular: Negative for palpitations.  Psychiatric/Behavioral: Negative for decreased concentration. The patient is not nervous/anxious.   All other systems reviewed and are negative.      Objective:   Physical Exam  Constitutional: She is oriented to person, place, and time. She appears well-developed and well-nourished. No distress.  HENT:  Head: Normocephalic.  Eyes: Pupils are equal, round, and reactive to light.  Neck: Normal range of motion. Neck supple. No thyromegaly present.  Cardiovascular: Normal rate, regular rhythm, normal heart sounds and intact distal pulses.   No murmur heard. Pulmonary/Chest: Effort normal and breath sounds normal. No respiratory distress. She has no wheezes.  Abdominal: Soft. Bowel sounds are normal. She exhibits no distension. There is no tenderness.  Musculoskeletal: Normal range of motion. She exhibits no edema or tenderness.  Neurological: She is alert and oriented to person, place, and time.  Skin: Skin is warm and dry.  Psychiatric: She has a normal mood and affect. Her behavior is normal. Judgment and thought content normal.  Vitals reviewed.     BP 127/89   Pulse 91   Temp 98.8 F (37.1 C) (Oral)   Ht 5\' 3"  (1.6 m)   Wt 212 lb 9.6 oz (96.4 kg)   BMI 37.66  kg/m      Assessment & Plan:  1. Anxiety and depression -Continue prozac  Stress management discussed RTO in 2 months for chronic follow up  Continue all medications!!  Low carb diet to have better control of HgbA1C ( 8.5 on 02/22/16)  Jannifer Rodneyhristy Denyla Cortese, FNP

## 2016-05-16 ENCOUNTER — Other Ambulatory Visit: Payer: Self-pay | Admitting: Pharmacist

## 2016-06-04 ENCOUNTER — Ambulatory Visit: Payer: Self-pay | Admitting: Pharmacist

## 2016-06-13 ENCOUNTER — Ambulatory Visit: Payer: Medicaid Other | Admitting: Family

## 2016-06-14 ENCOUNTER — Encounter: Payer: Self-pay | Admitting: Family

## 2016-06-20 ENCOUNTER — Encounter: Payer: Self-pay | Admitting: Family

## 2016-06-20 ENCOUNTER — Ambulatory Visit (INDEPENDENT_AMBULATORY_CARE_PROVIDER_SITE_OTHER): Payer: Medicaid Other | Admitting: Family

## 2016-06-20 VITALS — BP 118/87 | HR 89 | Temp 98.4°F | Ht 63.0 in | Wt 214.8 lb

## 2016-06-20 DIAGNOSIS — E119 Type 2 diabetes mellitus without complications: Secondary | ICD-10-CM | POA: Diagnosis not present

## 2016-06-20 DIAGNOSIS — F419 Anxiety disorder, unspecified: Secondary | ICD-10-CM

## 2016-06-20 DIAGNOSIS — E785 Hyperlipidemia, unspecified: Secondary | ICD-10-CM | POA: Diagnosis not present

## 2016-06-20 DIAGNOSIS — Z794 Long term (current) use of insulin: Secondary | ICD-10-CM

## 2016-06-20 DIAGNOSIS — F329 Major depressive disorder, single episode, unspecified: Secondary | ICD-10-CM

## 2016-06-20 DIAGNOSIS — I10 Essential (primary) hypertension: Secondary | ICD-10-CM | POA: Diagnosis not present

## 2016-06-20 DIAGNOSIS — E559 Vitamin D deficiency, unspecified: Secondary | ICD-10-CM | POA: Diagnosis not present

## 2016-06-20 DIAGNOSIS — F418 Other specified anxiety disorders: Secondary | ICD-10-CM | POA: Diagnosis not present

## 2016-06-20 DIAGNOSIS — Z23 Encounter for immunization: Secondary | ICD-10-CM

## 2016-06-20 DIAGNOSIS — F32A Depression, unspecified: Secondary | ICD-10-CM

## 2016-06-20 DIAGNOSIS — E669 Obesity, unspecified: Secondary | ICD-10-CM

## 2016-06-20 LAB — CMP14+EGFR
ALBUMIN: 3.9 g/dL (ref 3.5–5.5)
ALK PHOS: 78 IU/L (ref 39–117)
ALT: 12 IU/L (ref 0–32)
AST: 11 IU/L (ref 0–40)
Albumin/Globulin Ratio: 1.2 (ref 1.2–2.2)
BUN/Creatinine Ratio: 22 (ref 9–23)
BUN: 11 mg/dL (ref 6–20)
CHLORIDE: 94 mmol/L — AB (ref 96–106)
CO2: 25 mmol/L (ref 18–29)
CREATININE: 0.5 mg/dL — AB (ref 0.57–1.00)
Calcium: 9.6 mg/dL (ref 8.7–10.2)
GFR calc Af Amer: 141 mL/min/{1.73_m2} (ref >59)
GFR calc non Af Amer: 122 mL/min/{1.73_m2} (ref >59)
GLUCOSE: 142 mg/dL — AB (ref 65–99)
Globulin, Total: 3.2 g/dL (ref 1.5–4.5)
Potassium: 4.3 mmol/L (ref 3.5–5.2)
Sodium: 135 mmol/L (ref 134–144)
TOTAL PROTEIN: 7.1 g/dL (ref 6.0–8.5)

## 2016-06-20 LAB — LIPID PANEL
CHOLESTEROL TOTAL: 218 mg/dL — AB (ref 100–199)
Chol/HDL Ratio: 6.1 ratio units — ABNORMAL HIGH (ref 0.0–4.4)
HDL: 36 mg/dL — AB (ref >39)
LDL CALC: 152 mg/dL — AB (ref 0–99)
Triglycerides: 148 mg/dL (ref 0–149)
VLDL Cholesterol Cal: 30 mg/dL (ref 5–40)

## 2016-06-20 LAB — HEMOGLOBIN A1C: Hemoglobin A1C: 9.5 % — AB (ref 4.0–5.6)

## 2016-06-20 LAB — BAYER DCA HB A1C WAIVED: HB A1C (BAYER DCA - WAIVED): 9.5 % — ABNORMAL HIGH (ref <7.0)

## 2016-06-20 NOTE — Progress Notes (Signed)
 Subjective:    Patient ID: Kiara Mccall, female    DOB: 07/03/1975, 40 y.o.   MRN: [id number]  Pt presents to the office today for chronic follow up. Hypertension  This is a chronic problem. The current episode started more than 1 year ago. The problem has been resolved since onset. The problem is controlled. Associated symptoms include anxiety. Pertinent negatives include no blurred vision, headaches, palpitations, peripheral edema or shortness of breath. Risk factors for coronary artery disease include dyslipidemia, obesity, post-menopausal state, sedentary lifestyle and family history. Past treatments include ACE inhibitors and beta blockers. There is no history of kidney disease, CAD/MI, CVA or heart failure.  Diabetes  She presents for her follow-up diabetic visit. She has type 2 diabetes mellitus. Her disease course has been fluctuating. Hypoglycemia symptoms include nervousness/anxiousness. Pertinent negatives for hypoglycemia include no confusion or headaches. Associated symptoms include foot paresthesias. Pertinent negatives for diabetes include no blurred vision, no fatigue, no foot ulcerations and no visual change. There are no hypoglycemic complications. Symptoms are stable. Diabetic complications include peripheral neuropathy. Pertinent negatives for diabetic complications include no CVA, heart disease or nephropathy. Current diabetic treatment includes insulin injections and oral agent (monotherapy). She is compliant with treatment all of the time. She is following a generally unhealthy diet. She rarely participates in exercise. Her breakfast blood glucose range is generally 140-180 mg/dl. An ACE inhibitor/angiotensin II receptor blocker is being taken. Eye exam is not current.  Hyperlipidemia  This is a chronic problem. The current episode started more than 1 year ago. The problem is uncontrolled. Recent lipid tests were reviewed and are high. Exacerbating diseases include obesity.  Pertinent negatives include no shortness of breath. Current antihyperlipidemic treatment includes statins (Pt states over the last few months she has not take medication). The current treatment provides no improvement of lipids. Risk factors for coronary artery disease include dyslipidemia, diabetes mellitus, family history, hypertension, obesity and post-menopausal.  Depression       The patient presents with depression.  This is a chronic problem.  The current episode started more than 1 year ago.   The onset quality is gradual.   The problem occurs intermittently.  The problem has been resolved since onset.  Associated symptoms include decreased concentration and decreased interest.  Associated symptoms include no fatigue, no helplessness, no hopelessness, does not have insomnia, not irritable, no restlessness, no headaches and not sad.  Compliance with treatment is good.  Past medical history includes anxiety and depression.   Anxiety  Presents for follow-up visit. Onset was more than 5 years ago. The problem has been resolved. Symptoms include decreased concentration, depressed mood, excessive worry, nervous/anxious behavior and panic. Patient reports no confusion, insomnia, malaise, palpitations, restlessness or shortness of breath. Symptoms occur rarely. The symptoms are aggravated by family issues. The quality of sleep is good.   Her past medical history is significant for anxiety/panic attacks and depression. Past treatments include SSRIs. The treatment provided moderate relief. Compliance with prior treatments has been good.      Review of Systems  Constitutional: Negative.  Negative for fatigue.  HENT: Negative.   Eyes: Negative.  Negative for blurred vision.  Respiratory: Negative.  Negative for shortness of breath.   Cardiovascular: Negative.  Negative for palpitations.  Gastrointestinal: Negative.   Endocrine: Negative.   Genitourinary: Negative.   Musculoskeletal: Negative.     Neurological: Negative for headaches.  Hematological: Negative.   Psychiatric/Behavioral: Positive for decreased concentration and depression. Negative for confusion. The patient   is nervous/anxious. The patient does not have insomnia.   All other systems reviewed and are negative.      Objective:   Physical Exam  Constitutional: She is oriented to person, place, and time. She appears well-developed and well-nourished. She is not irritable. No distress.  HENT:  Head: Normocephalic and atraumatic.  Right Ear: External ear normal.  Left Ear: External ear normal.  Nose: Nose normal.  Mouth/Throat: Oropharynx is clear and moist.  Eyes: Pupils are equal, round, and reactive to light.  Neck: Normal range of motion. Neck supple. No thyromegaly present.  Cardiovascular: Normal rate, regular rhythm, normal heart sounds and intact distal pulses.   No murmur heard. Pulmonary/Chest: Effort normal and breath sounds normal. No respiratory distress. She has no wheezes.  Abdominal: Soft. Bowel sounds are normal. She exhibits no distension. There is no tenderness.  Musculoskeletal: Normal range of motion. She exhibits no edema or tenderness.  Neurological: She is alert and oriented to person, place, and time.  Skin: Skin is warm and dry.  Generalized scars on bilateral arms, neck, back, abdomen, and legs   Psychiatric: She has a normal mood and affect. Her behavior is normal. Judgment and thought content normal.  Vitals reviewed.    BP 118/87   Pulse 89   Temp 98.4 F (36.9 C) (Oral)   Ht 5' 3" (1.6 m)   Wt 214 lb 12.8 oz (97.4 kg)   BMI 38.05 kg/m       Assessment & Plan:  1. Essential hypertension - CMP14+EGFR  2. Diabetes mellitus type 2, insulin dependent ([facility]) - CMP14+EGFR - Bayer DCA Hb A1c Waived - Ambulatory referral to Ophthalmology  3. Anxiety and depression - CMP14+EGFR  4. Hyperlipidemia, unspecified hyperlipidemia type - CMP14+EGFR - Lipid panel  5. Obesity  (BMI 30-39.9) - CMP14+EGFR  6. Vitamin D deficiency - CMP14+EGFR   Continue all meds Labs pending Health Maintenance reviewed-TDAP given today Diet and exercise encouraged RTO 3 months   Evelina Dun, FNP

## 2016-06-20 NOTE — Addendum Note (Signed)
Addended by: Almeta MonasSTONE, JANIE M on: 06/20/2016 09:19 AM   Modules accepted: Orders

## 2016-06-20 NOTE — Patient Instructions (Signed)
Diabetes Mellitus and Food It is important for you to manage your blood sugar (glucose) level. Your blood glucose level can be greatly affected by what you eat. Eating healthier foods in the appropriate amounts throughout the day at about the same time each day will help you control your blood glucose level. It can also help slow or prevent worsening of your diabetes mellitus. Healthy eating may even help you improve the level of your blood pressure and reach or maintain a healthy weight. General recommendations for healthful eating and cooking habits include:  Eating meals and snacks regularly. Avoid going long periods of time without eating to lose weight.  Eating a diet that consists mainly of plant-based foods, such as fruits, vegetables, nuts, legumes, and whole grains.  Using low-heat cooking methods, such as baking, instead of high-heat cooking methods, such as deep frying.  Work with your dietitian to make sure you understand how to use the Nutrition Facts information on food labels. How can food affect me? Carbohydrates Carbohydrates affect your blood glucose level more than any other type of food. Your dietitian will help you determine how many carbohydrates to eat at each meal and teach you how to count carbohydrates. Counting carbohydrates is important to keep your blood glucose at a healthy level, especially if you are using insulin or taking certain medicines for diabetes mellitus. Alcohol Alcohol can cause sudden decreases in blood glucose (hypoglycemia), especially if you use insulin or take certain medicines for diabetes mellitus. Hypoglycemia can be a life-threatening condition. Symptoms of hypoglycemia (sleepiness, dizziness, and disorientation) are similar to symptoms of having too much alcohol. If your health care provider has given you approval to drink alcohol, do so in moderation and use the following guidelines:  Women should not have more than one drink per day, and men  should not have more than two drinks per day. One drink is equal to: ? 12 oz of beer. ? 5 oz of wine. ? 1 oz of hard liquor.  Do not drink on an empty stomach.  Keep yourself hydrated. Have water, diet soda, or unsweetened iced tea.  Regular soda, juice, and other mixers might contain a lot of carbohydrates and should be counted.  What foods are not recommended? As you make food choices, it is important to remember that all foods are not the same. Some foods have fewer nutrients per serving than other foods, even though they might have the same number of calories or carbohydrates. It is difficult to get your body what it needs when you eat foods with fewer nutrients. Examples of foods that you should avoid that are high in calories and carbohydrates but low in nutrients include:  Trans fats (most processed foods list trans fats on the Nutrition Facts label).  Regular soda.  Juice.  Candy.  Sweets, such as cake, pie, doughnuts, and cookies.  Fried foods.  What foods can I eat? Eat nutrient-rich foods, which will nourish your body and keep you healthy. The food you should eat also will depend on several factors, including:  The calories you need.  The medicines you take.  Your weight.  Your blood glucose level.  Your blood pressure level.  Your cholesterol level.  You should eat a variety of foods, including:  Protein. ? Lean cuts of meat. ? Proteins low in saturated fats, such as fish, egg whites, and beans. Avoid processed meats.  Fruits and vegetables. ? Fruits and vegetables that may help control blood glucose levels, such as apples,   mangoes, and yams.  Dairy products. ? Choose fat-free or low-fat dairy products, such as milk, yogurt, and cheese.  Grains, bread, pasta, and rice. ? Choose whole grain products, such as multigrain bread, whole oats, and brown rice. These foods may help control blood pressure.  Fats. ? Foods containing healthful fats, such as  nuts, avocado, olive oil, canola oil, and fish.  Does everyone with diabetes mellitus have the same meal plan? Because every person with diabetes mellitus is different, there is not one meal plan that works for everyone. It is very important that you meet with a dietitian who will help you create a meal plan that is just right for you. This information is not intended to replace advice given to you by your health care provider. Make sure you discuss any questions you have with your health care provider. Document Released: 12/27/2004 Document Revised: 09/07/2015 Document Reviewed: 02/26/2013 Elsevier Interactive Patient Education  2017 [employer] Inc.  

## 2016-06-21 ENCOUNTER — Other Ambulatory Visit: Payer: Self-pay | Admitting: Family

## 2016-07-14 ENCOUNTER — Other Ambulatory Visit: Payer: Self-pay | Admitting: Family

## 2016-07-19 LAB — HM DIABETES EYE EXAM

## 2016-09-23 ENCOUNTER — Other Ambulatory Visit: Payer: Self-pay | Admitting: Family

## 2016-09-23 DIAGNOSIS — Z794 Long term (current) use of insulin: Principal | ICD-10-CM

## 2016-09-23 DIAGNOSIS — E119 Type 2 diabetes mellitus without complications: Secondary | ICD-10-CM

## 2016-09-23 DIAGNOSIS — I10 Essential (primary) hypertension: Secondary | ICD-10-CM

## 2016-09-24 ENCOUNTER — Telehealth: Payer: Self-pay | Admitting: Pharmacist

## 2016-09-24 NOTE — Telephone Encounter (Signed)
Tired to call patient to check on BG and see if she has any questions about medications.  No answer but LM on VM.

## 2016-09-26 ENCOUNTER — Ambulatory Visit: Payer: Medicaid Other | Admitting: Family

## 2016-09-27 ENCOUNTER — Telehealth: Payer: Self-pay

## 2016-09-30 ENCOUNTER — Encounter: Payer: Self-pay | Admitting: Family

## 2016-09-30 NOTE — Telephone Encounter (Signed)
Will forward to Debbi - it appears she was able to get PA in past.

## 2016-09-30 NOTE — Telephone Encounter (Signed)
Tammy can you look at this for me? It looks like this has happened before. Do I need to get Debbie to try to get approved or should we change?

## 2016-10-03 ENCOUNTER — Ambulatory Visit (INDEPENDENT_AMBULATORY_CARE_PROVIDER_SITE_OTHER): Payer: Medicaid Other | Admitting: Family

## 2016-10-03 ENCOUNTER — Encounter: Payer: Self-pay | Admitting: Family

## 2016-10-03 VITALS — BP 131/88 | HR 104 | Temp 98.5°F | Ht 63.0 in | Wt 217.2 lb

## 2016-10-03 DIAGNOSIS — E785 Hyperlipidemia, unspecified: Secondary | ICD-10-CM

## 2016-10-03 DIAGNOSIS — N898 Other specified noninflammatory disorders of vagina: Secondary | ICD-10-CM | POA: Diagnosis not present

## 2016-10-03 DIAGNOSIS — I10 Essential (primary) hypertension: Secondary | ICD-10-CM | POA: Diagnosis not present

## 2016-10-03 DIAGNOSIS — E669 Obesity, unspecified: Secondary | ICD-10-CM | POA: Diagnosis not present

## 2016-10-03 DIAGNOSIS — F329 Major depressive disorder, single episode, unspecified: Secondary | ICD-10-CM | POA: Diagnosis not present

## 2016-10-03 DIAGNOSIS — E119 Type 2 diabetes mellitus without complications: Secondary | ICD-10-CM | POA: Diagnosis not present

## 2016-10-03 DIAGNOSIS — Z794 Long term (current) use of insulin: Secondary | ICD-10-CM

## 2016-10-03 DIAGNOSIS — F419 Anxiety disorder, unspecified: Secondary | ICD-10-CM

## 2016-10-03 LAB — LIPID PANEL
Cholesterol: 187 (ref 0–200)
HDL: 31 — AB (ref 35–70)
LDL CALC: 124
Triglycerides: 162 — AB (ref 40–160)

## 2016-10-03 LAB — BAYER DCA HB A1C WAIVED: HB A1C: 11.3 % — AB (ref <7.0)

## 2016-10-03 LAB — WET PREP FOR TRICH, YEAST, CLUE
Clue Cell Exam: NEGATIVE
TRICHOMONAS EXAM: NEGATIVE
Yeast Exam: NEGATIVE

## 2016-10-03 LAB — HEMOGLOBIN A1C: HEMOGLOBIN A1C: 11.3 % — AB (ref 4.0–5.6)

## 2016-10-03 NOTE — Patient Instructions (Signed)
Diabetes Mellitus and Food It is important for you to manage your blood sugar (glucose) level. Your blood glucose level can be greatly affected by what you eat. Eating healthier foods in the appropriate amounts throughout the day at about the same time each day will help you control your blood glucose level. It can also help slow or prevent worsening of your diabetes mellitus. Healthy eating may even help you improve the level of your blood pressure and reach or maintain a healthy weight. General recommendations for healthful eating and cooking habits include:  Eating meals and snacks regularly. Avoid going long periods of time without eating to lose weight.  Eating a diet that consists mainly of plant-based foods, such as fruits, vegetables, nuts, legumes, and whole grains.  Using low-heat cooking methods, such as baking, instead of high-heat cooking methods, such as deep frying.  Work with your dietitian to make sure you understand how to use the Nutrition Facts information on food labels. How can food affect me? Carbohydrates Carbohydrates affect your blood glucose level more than any other type of food. Your dietitian will help you determine how many carbohydrates to eat at each meal and teach you how to count carbohydrates. Counting carbohydrates is important to keep your blood glucose at a healthy level, especially if you are using insulin or taking certain medicines for diabetes mellitus. Alcohol Alcohol can cause sudden decreases in blood glucose (hypoglycemia), especially if you use insulin or take certain medicines for diabetes mellitus. Hypoglycemia can be a life-threatening condition. Symptoms of hypoglycemia (sleepiness, dizziness, and disorientation) are similar to symptoms of having too much alcohol. If your health care provider has given you approval to drink alcohol, do so in moderation and use the following guidelines:  Women should not have more than one drink per day, and men  should not have more than two drinks per day. One drink is equal to: ? 12 oz of beer. ? 5 oz of wine. ? 1 oz of hard liquor.  Do not drink on an empty stomach.  Keep yourself hydrated. Have water, diet soda, or unsweetened iced tea.  Regular soda, juice, and other mixers might contain a lot of carbohydrates and should be counted.  What foods are not recommended? As you make food choices, it is important to remember that all foods are not the same. Some foods have fewer nutrients per serving than other foods, even though they might have the same number of calories or carbohydrates. It is difficult to get your body what it needs when you eat foods with fewer nutrients. Examples of foods that you should avoid that are high in calories and carbohydrates but low in nutrients include:  Trans fats (most processed foods list trans fats on the Nutrition Facts label).  Regular soda.  Juice.  Candy.  Sweets, such as cake, pie, doughnuts, and cookies.  Fried foods.  What foods can I eat? Eat nutrient-rich foods, which will nourish your body and keep you healthy. The food you should eat also will depend on several factors, including:  The calories you need.  The medicines you take.  Your weight.  Your blood glucose level.  Your blood pressure level.  Your cholesterol level.  You should eat a variety of foods, including:  Protein. ? Lean cuts of meat. ? Proteins low in saturated fats, such as fish, egg whites, and beans. Avoid processed meats.  Fruits and vegetables. ? Fruits and vegetables that may help control blood glucose levels, such as apples,   mangoes, and yams.  Dairy products. ? Choose fat-free or low-fat dairy products, such as milk, yogurt, and cheese.  Grains, bread, pasta, and rice. ? Choose whole grain products, such as multigrain bread, whole oats, and brown rice. These foods may help control blood pressure.  Fats. ? Foods containing healthful fats, such as  nuts, avocado, olive oil, canola oil, and fish.  Does everyone with diabetes mellitus have the same meal plan? Because every person with diabetes mellitus is different, there is not one meal plan that works for everyone. It is very important that you meet with a dietitian who will help you create a meal plan that is just right for you. This information is not intended to replace advice given to you by your health care provider. Make sure you discuss any questions you have with your health care provider. Document Released: 12/27/2004 Document Revised: 09/07/2015 Document Reviewed: 02/26/2013 Elsevier Interactive Patient Education  2017 [employer] Inc.  

## 2016-10-03 NOTE — Progress Notes (Signed)
Subjective:    Patient ID: Kiara Mccall, female    DOB: 1975/10/26, 41 y.o.   MRN: [id number]  PT presents to the office today for chronic follow up. Pt states her blood sugars have been elevated because she got frustrated and stopped her medications, but has restarted them now.  Diabetes  She presents for her follow-up diabetic visit. She has type 2 diabetes mellitus. Her disease course has been worsening. Hypoglycemia symptoms include nervousness/anxiousness. Pertinent negatives for diabetes include no blurred vision and no foot paresthesias. There are no hypoglycemic complications. Symptoms are stable. Pertinent negatives for diabetic complications include no CVA, heart disease, nephropathy or peripheral neuropathy. Risk factors for coronary artery disease include dyslipidemia, diabetes mellitus, obesity, sedentary lifestyle and family history. Her breakfast blood glucose range is generally >200 mg/dl. Eye exam is current.  Hypertension  This is a chronic problem. The current episode started more than 1 year ago. The problem has been resolved since onset. The problem is controlled. Associated symptoms include anxiety and shortness of breath ("at times"). Pertinent negatives include no blurred vision or peripheral edema. Risk factors for coronary artery disease include diabetes mellitus, dyslipidemia, family history and obesity. The current treatment provides moderate improvement. There is no history of CVA.  Anxiety  Presents for follow-up visit. Symptoms include depressed mood, excessive worry, irritability, nervous/anxious behavior and shortness of breath ("at times"). Symptoms occur occasionally.    Vaginal Discharge  The patient's primary symptoms include vaginal discharge. The current episode started more than 1 month ago. The problem occurs constantly. The problem has been gradually worsening. The patient is experiencing no pain. The vaginal discharge was brown.      Review of  Systems  Constitutional: Positive for irritability.  Eyes: Negative for blurred vision.  Respiratory: Positive for shortness of breath ("at times").   Genitourinary: Positive for vaginal discharge.  Psychiatric/Behavioral: The patient is nervous/anxious.   All other systems reviewed and are negative.      Objective:   Physical Exam  Constitutional: She is oriented to person, place, and time. She appears well-developed and well-nourished. No distress.  HENT:  Head: Normocephalic and atraumatic.  Right Ear: External ear normal.  Mouth/Throat: Oropharynx is clear and moist.  Eyes: Pupils are equal, round, and reactive to light.  Neck: Normal range of motion. Neck supple. No thyromegaly present.  Cardiovascular: Normal rate, regular rhythm, normal heart sounds and intact distal pulses.   No murmur heard. Pulmonary/Chest: Effort normal and breath sounds normal. No respiratory distress. She has no wheezes.  Abdominal: Soft. Bowel sounds are normal. She exhibits no distension. There is no tenderness.  Musculoskeletal: Normal range of motion. She exhibits no edema or tenderness.  Neurological: She is alert and oriented to person, place, and time.  Skin: Skin is warm and dry.  Scattered scaring on bilateral arms  Psychiatric: She has a normal mood and affect. Her behavior is normal. Judgment and thought content normal.  Vitals reviewed.     BP 131/88   Pulse (!) 104   Temp 98.5 F (36.9 C) (Oral)   Ht 5' 3"  (1.6 m)   Wt 217 lb 3.2 oz (98.5 kg)   BMI 38.48 kg/m      Assessment & Plan:  1. Essential hypertension - CMP14+EGFR  2. Diabetes mellitus type 2, insulin dependent ([facility]) - Bayer DCA Hb A1c Waived - CMP14+EGFR - Microalbumin / creatinine urine ratio  3. Obesity (BMI 30-39.9 - CMP14+EGFR  4. Hyperlipidemia, unspecified hyperlipidemia type - CMP14+EGFR -  Lipid panel  5. Anxiety and depression - CMP14+EGFR  6. Vaginal discharg - WET PREP FOR [place], YEAST,  CLUE   Continue all meds Labs pending Health Maintenance reviewed Diet and exercise encouraged RTO 3 months   Evelina Dun, FNP

## 2016-10-04 LAB — LIPID PANEL
CHOL/HDL RATIO: 6 ratio — AB (ref 0.0–4.4)
CHOLESTEROL TOTAL: 187 mg/dL (ref 100–199)
HDL: 31 mg/dL — AB (ref >39)
LDL Calculated: 124 mg/dL — ABNORMAL HIGH (ref 0–99)
TRIGLYCERIDES: 162 mg/dL — AB (ref 0–149)
VLDL Cholesterol Cal: 32 mg/dL (ref 5–40)

## 2016-10-04 LAB — CMP14+EGFR
A/G RATIO: 1.4 (ref 1.2–2.2)
ALK PHOS: 89 IU/L (ref 39–117)
ALT: 18 IU/L (ref 0–32)
AST: 18 IU/L (ref 0–40)
Albumin: 4.3 g/dL (ref 3.5–5.5)
BUN/Creatinine Ratio: 20 (ref 9–23)
BUN: 10 mg/dL (ref 6–20)
Bilirubin Total: <0.2 mg/dL (ref 0.0–1.2)
CHLORIDE: 99 mmol/L (ref 96–106)
CO2: 23 mmol/L (ref 20–29)
Calcium: 9.8 mg/dL (ref 8.7–10.2)
Creatinine, Ser: 0.49 mg/dL — ABNORMAL LOW (ref 0.57–1.00)
GFR calc Af Amer: 142 mL/min/{1.73_m2} (ref >59)
GFR calc non Af Amer: 123 mL/min/{1.73_m2} (ref >59)
Globulin, Total: 3.1 g/dL (ref 1.5–4.5)
Glucose: 226 mg/dL — ABNORMAL HIGH (ref 65–99)
POTASSIUM: 4.8 mmol/L (ref 3.5–5.2)
Sodium: 138 mmol/L (ref 134–144)
Total Protein: 7.4 g/dL (ref 6.0–8.5)

## 2016-10-04 LAB — MICROALBUMIN / CREATININE URINE RATIO
Creatinine, Urine: 185.1 mg/dL
MICROALB/CREAT RATIO: 172.4 mg/g{creat} — AB (ref 0.0–30.0)
Microalbumin, Urine: 319.1 ug/mL

## 2016-10-15 ENCOUNTER — Ambulatory Visit (INDEPENDENT_AMBULATORY_CARE_PROVIDER_SITE_OTHER): Payer: Medicaid Other

## 2016-10-15 ENCOUNTER — Ambulatory Visit: Payer: Medicaid Other

## 2016-10-15 DIAGNOSIS — Z111 Encounter for screening for respiratory tuberculosis: Secondary | ICD-10-CM

## 2016-10-17 LAB — TB SKIN TEST
INDURATION: 0 mm
TB Skin Test: NEGATIVE

## 2016-10-22 ENCOUNTER — Ambulatory Visit: Payer: Medicaid Other | Admitting: Pharmacist

## 2016-10-24 ENCOUNTER — Encounter: Payer: Self-pay | Admitting: Family

## 2017-01-07 ENCOUNTER — Ambulatory Visit: Payer: Medicaid Other | Admitting: Family

## 2017-01-18 LAB — HEMOGLOBIN A1C: Hemoglobin A1C: 13.9 % — AB (ref 4.0–5.6)

## 2017-02-21 ENCOUNTER — Other Ambulatory Visit: Payer: Self-pay | Admitting: Family

## 2017-02-21 DIAGNOSIS — Z794 Long term (current) use of insulin: Principal | ICD-10-CM

## 2017-02-21 DIAGNOSIS — I10 Essential (primary) hypertension: Secondary | ICD-10-CM

## 2017-02-21 DIAGNOSIS — E119 Type 2 diabetes mellitus without complications: Secondary | ICD-10-CM

## 2017-05-13 LAB — HEMOGLOBIN A1C: Hemoglobin A1C: 11.4 % — AB (ref 4.0–5.6)

## 2017-05-22 IMAGING — CR DG SHOULDER 2+V*R*
3 series · 3 of 3 positions shown · non-contrast
Comparison: None in PACs

CLINICAL DATA: Shoulder pain with no known injury

EXAM:
RIGHT SHOULDER - 2+ VIEW

[view not recorded (1 of 3)]
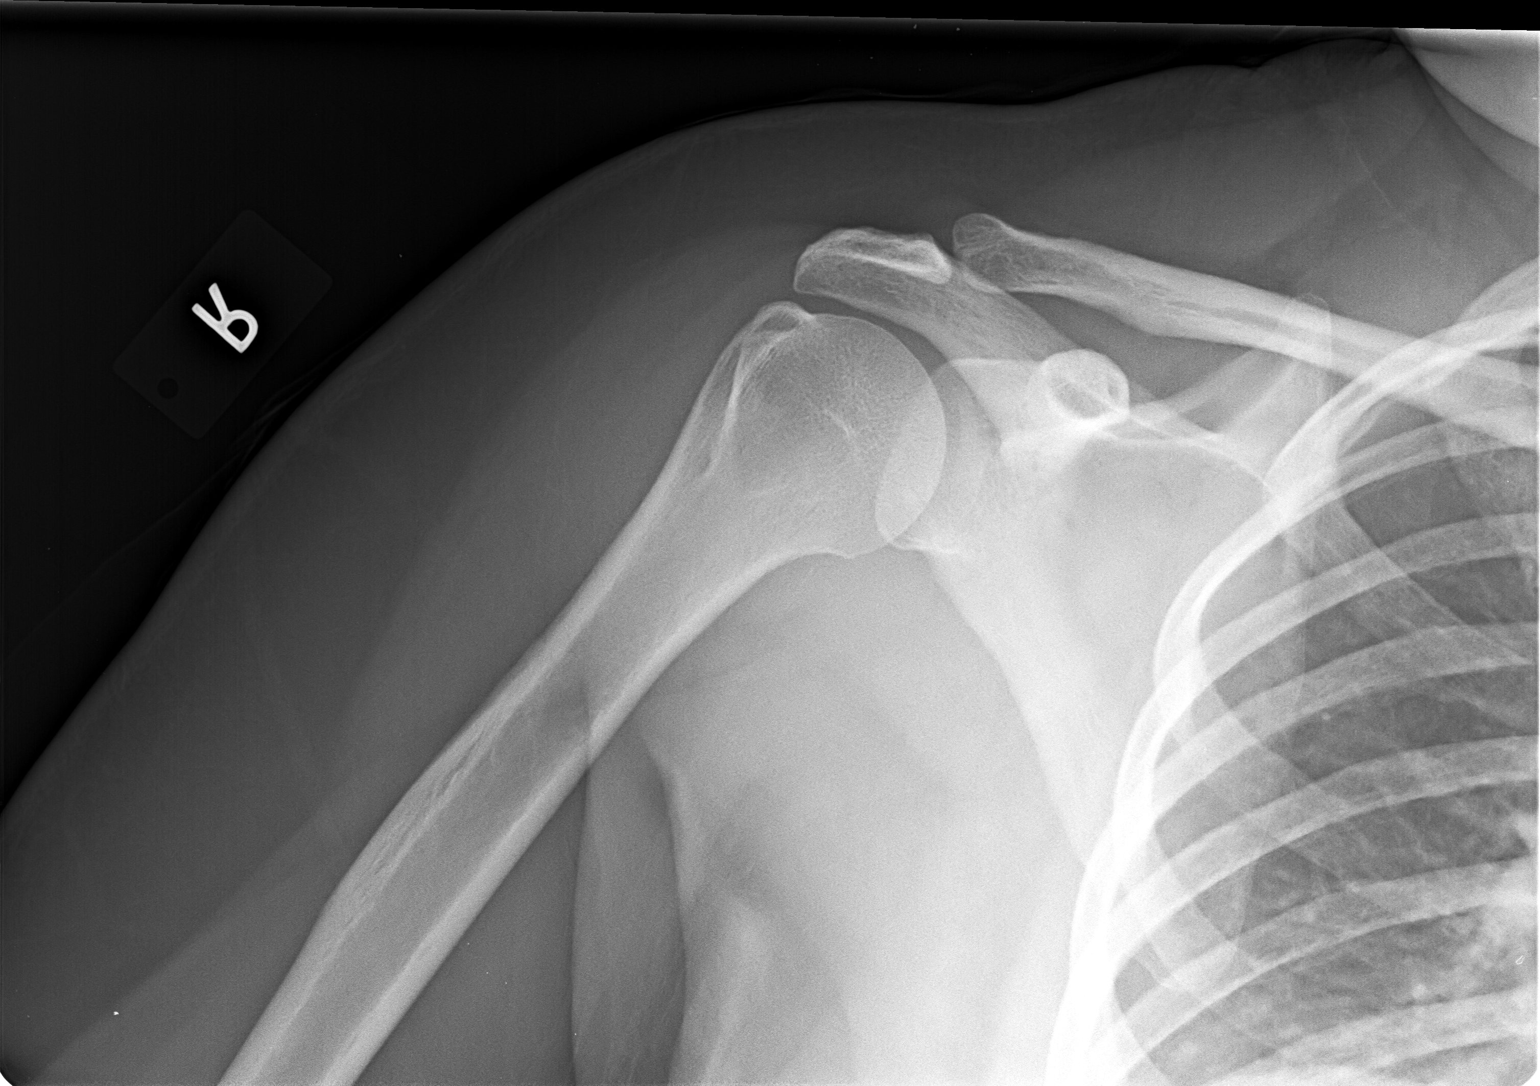

[view not recorded (2 of 3)]
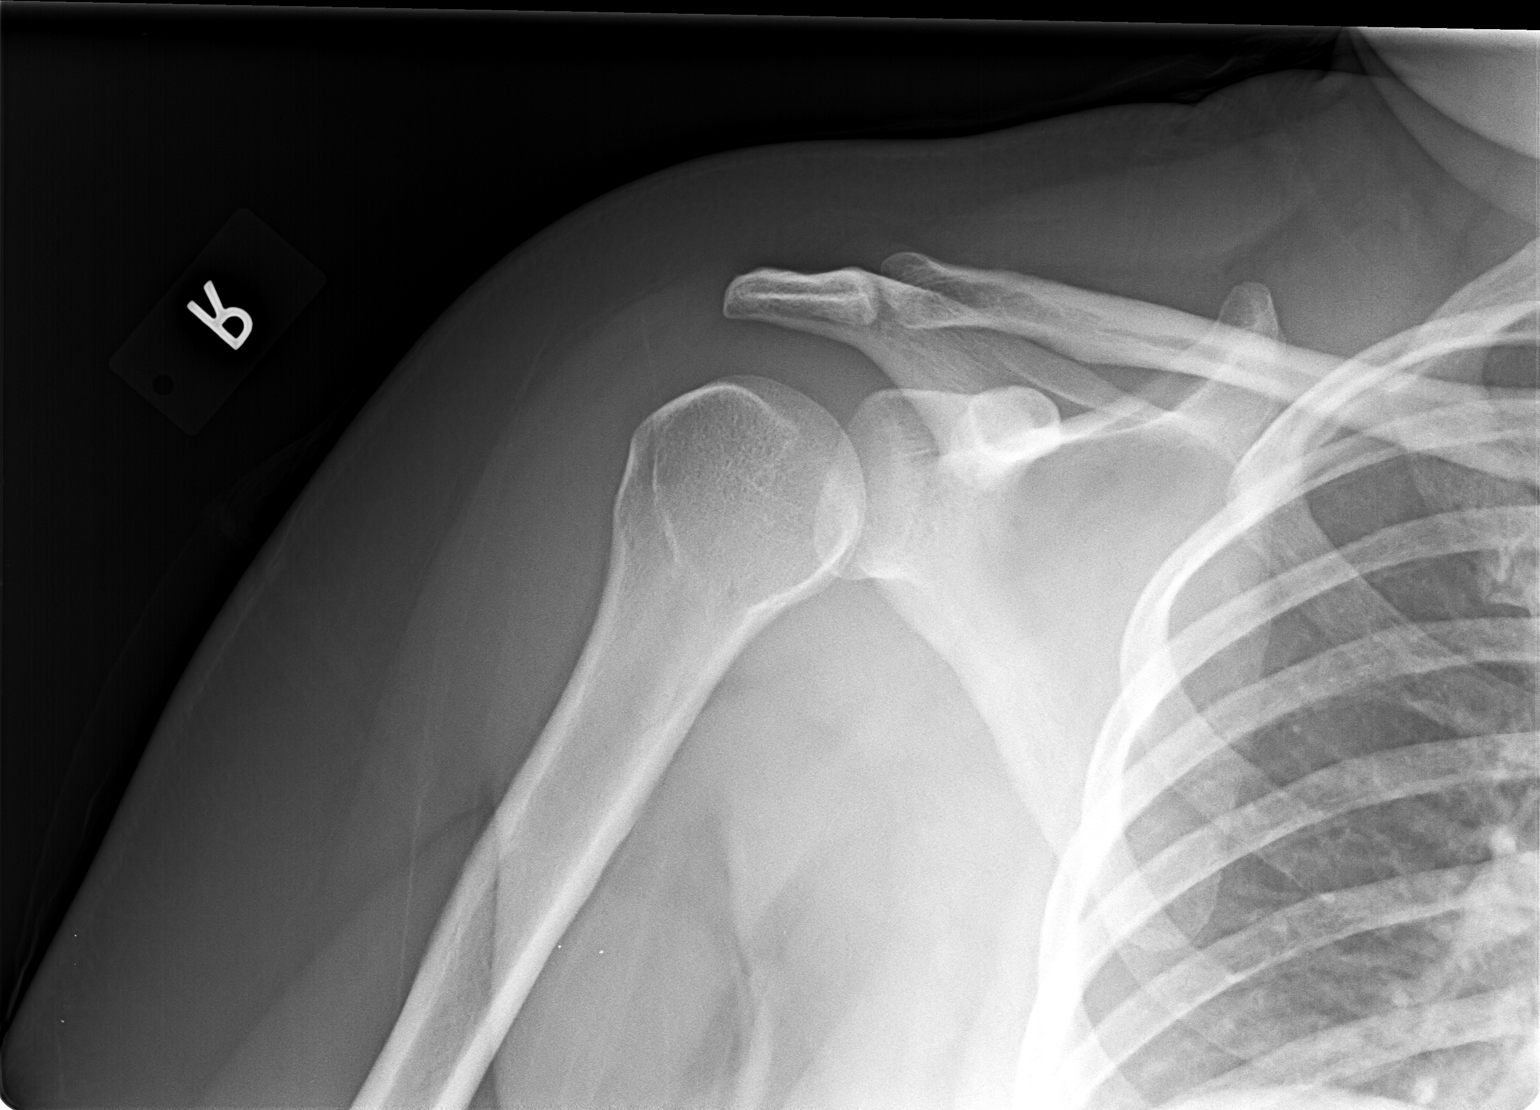

[view not recorded (3 of 3)]
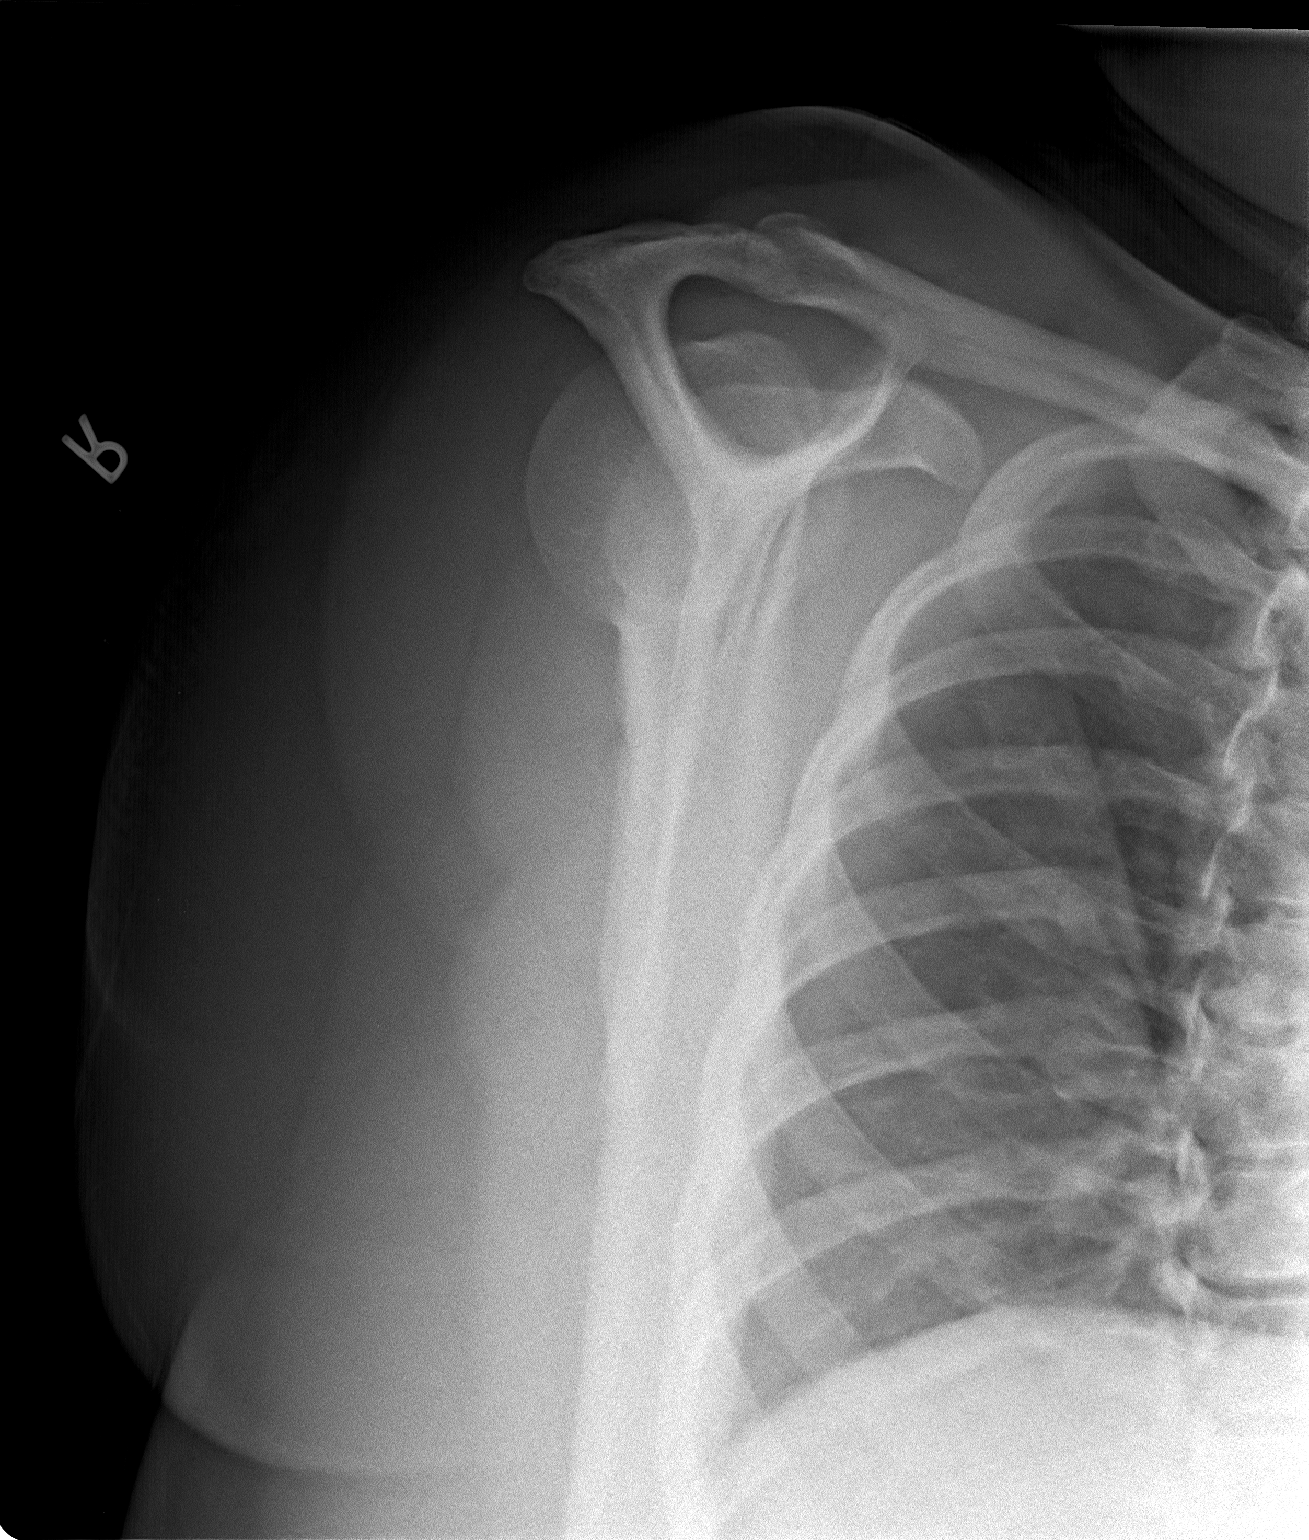

[3 of 3 positions shown; findings below may reference images not displayed]

FINDINGS: The bones of the right shoulder are adequately mineralized. There is
no acute fracture nor dislocation. There is no lytic nor blastic
bony lesion. The observed portions of the right clavicle and upper
right ribs are normal.
IMPRESSION: There is no acute or chronic bony abnormality of the right shoulder.

## 2017-06-12 ENCOUNTER — Ambulatory Visit: Payer: Self-pay | Admitting: "Endocrinology

## 2017-08-19 ENCOUNTER — Ambulatory Visit: Payer: Self-pay | Admitting: "Endocrinology

## 2017-12-11 ENCOUNTER — Encounter: Payer: Self-pay | Admitting: "Endocrinology

## 2017-12-11 ENCOUNTER — Ambulatory Visit (INDEPENDENT_AMBULATORY_CARE_PROVIDER_SITE_OTHER): Payer: Medicaid Other | Admitting: "Endocrinology

## 2017-12-11 VITALS — BP 148/89 | HR 90 | Ht 63.0 in | Wt 216.0 lb

## 2017-12-11 DIAGNOSIS — E1165 Type 2 diabetes mellitus with hyperglycemia: Secondary | ICD-10-CM

## 2017-12-11 DIAGNOSIS — I10 Essential (primary) hypertension: Secondary | ICD-10-CM | POA: Diagnosis not present

## 2017-12-11 DIAGNOSIS — E785 Hyperlipidemia, unspecified: Secondary | ICD-10-CM

## 2017-12-11 MED ORDER — ACCU-CHEK GUIDE ME W/DEVICE KIT: 1.0000 | PACK | 0 refills | Status: AC

## 2017-12-11 MED ORDER — ATORVASTATIN CALCIUM 20 MG PO TABS: 20.0000 mg | ORAL_TABLET | Freq: Every day | ORAL | 2 refills | Status: AC

## 2017-12-11 MED ORDER — GLUCOSE BLOOD VI STRP: ORAL_STRIP | 5 refills | Status: AC

## 2017-12-11 MED ORDER — GLUCOSE BLOOD VI STRP: ORAL_STRIP | 2 refills | Status: DC

## 2017-12-11 NOTE — Patient Instructions (Signed)

## 2017-12-12 NOTE — Progress Notes (Signed)
Endocrinology Consult Note       12/12/2017, 9:16 AM   Subjective:    Patient ID: Kiara Mccall, female    DOB: 13-Mar-1976.  Letha Mirabal is being seen in consultation for management of currently uncontrolled symptomatic diabetes requested by  No primary care provider on file..   Past Medical History:  Diagnosis Date  . Anxiety    pt states she has panic attacks, she had been on xanax and prozac in the past  . Depression    pt had been on prozac in the past  . Diabetes mellitus without complication ([place])   . Hyperlipidemia   . Hypertension    History reviewed. No pertinent surgical history. Social History   Socioeconomic History  . Marital status: Single    Spouse name: Not on file  . Number of children: Not on file  . Years of education: Not on file  . Highest education level: Not on file  Occupational History  . Not on file  Social Needs  . Financial resource strain: Not on file  . Food insecurity:    Worry: Not on file    Inability: Not on file  . Transportation needs:    Medical: Not on file    Non-medical: Not on file  Tobacco Use  . Smoking status: Former Smoker    Types: Cigarettes    Last attempt to quit: 04/15/2002    Years since quitting: 15.6  . Smokeless tobacco: Never Used  Substance and Sexual Activity  . Alcohol use: No    Alcohol/week: 0.0 standard drinks  . Drug use: No  . Sexual activity: Yes    Birth control/protection: None  Lifestyle  . Physical activity:    Days per week: Not on file    Minutes per session: Not on file  . Stress: Not on file  Relationships  . Social connections:    Talks on phone: Not on file    Gets together: Not on file    Attends religious service: Not on file    Active member of club or organization: Not on file    Attends meetings of clubs or organizations: Not on file    Relationship status: Not on file  Other Topics Concern   . Not on file  Social History Narrative  . Not on file   Outpatient Encounter Medications as of 12/11/2017  Medication Sig  . aspirin EC 81 MG tablet Take 1 tablet (81 mg total) by mouth daily.  . Insulin Glargine (LANTUS SOLOSTAR) 100 UNIT/ML Solostar Pen Inject 50 Units into the skin at bedtime.  Marland Kitchen lisinopril-hydrochlorothiazide (ZESTORETIC) 20-12.5 MG per tablet Take 2 tablets by mouth daily.  . metFORMIN (GLUCOPHAGE) 1000 MG tablet TAKE ONE TABLET BY MOUTH TWICE DAILY WITH FOOD  . metoprolol succinate (TOPROL-XL) 25 MG 24 hr tablet TAKE ONE TABLET BY MOUTH ONCE DAILY  . atorvastatin (LIPITOR) 20 MG tablet Take 1 tablet (20 mg total) by mouth daily.  . Blood Glucose Monitoring Suppl (ACCU-CHEK GUIDE ME) w/Device KIT 1 Piece by Does not apply route as directed.  Marland Kitchen glucose blood (ACCU-CHEK GUIDE) test strip Use as instructed  4 x daily. E11.65  . [DISCONTINUED] ACCU-CHEK AVIVA PLUS test strip USE ONE STRIP TO CHECK GLUCOSE THREE TIMES DAILY  . [DISCONTINUED] ACCU-CHEK SOFTCLIX LANCETS lancets USE ONE  TO CHECK GLUCOSE THREE TIMES DAILY TO CHECK BLOOD GLUCOSE  . [DISCONTINUED] atorvastatin (LIPITOR) 40 MG tablet TAKE ONE TABLET BY MOUTH ONCE DAILY  . [DISCONTINUED] Blood Glucose Monitoring Suppl (ACCU-CHEK AVIVA PLUS) W/DEVICE KIT Use to check BG up to tid.  . [DISCONTINUED] FLUoxetine (PROZAC) 40 MG capsule Take 1 capsule (40 mg total) by mouth daily.  . [DISCONTINUED] glucose blood (ACCU-CHEK GUIDE) test strip Use as instructed  . [DISCONTINUED] Insulin Pen Needle (PEN NEEDLES) 32G X 4 MM MISC 1 each by Does not apply route 4 (four) times daily. Use to inject insulin up to qid  . [DISCONTINUED] XULTOPHY 100-3.6 UNIT-MG/ML SOPN INJECT 16-30 UNITS SUBCUTANEOUSLY ONCE DAILY   No facility-administered encounter medications on file as of 12/11/2017.     ALLERGIES: No Known Allergies  VACCINATION STATUS: Immunization History  Administered Date(s) Administered  . Influenza,inj,Quad PF,6+  Mos 02/01/2015, 01/10/2016  . PPD Test 01/10/2015, 10/15/2016  . Pneumococcal Conjugate-13 11/22/2014  . Tdap 06/20/2016    Diabetes  She presents for her initial diabetic visit. She has type 2 diabetes mellitus. Onset time: She was diagnosed at approximate age of 64 years. Her disease course has been worsening. There are no hypoglycemic associated symptoms. Pertinent negatives for hypoglycemia include no headaches, seizures or tremors. Associated symptoms include blurred vision, polydipsia and polyuria. Pertinent negatives for diabetes include no chest pain. There are no hypoglycemic complications. Symptoms are worsening. There are no diabetic complications. Risk factors for coronary artery disease include diabetes mellitus, dyslipidemia, family history, obesity, hypertension, sedentary lifestyle and tobacco exposure. Current diabetic treatment includes insulin injections and oral agent (monotherapy). Her weight is fluctuating minimally. She is following a generally unhealthy diet. When asked about meal planning, she reported none. She has not had a previous visit with a dietitian. She never participates in exercise. (Did not bring any meter nor logs to review today.  Her most recent A1c was 11.4% on May 13, 2017.  Her prior measurements of her A1c is from the most recent include 13.9%, 11.3%, 11.4%, 9.5%.) An ACE inhibitor/angiotensin II receptor blocker is being taken. She does not see a podiatrist.Eye exam is not current.  Hypertension  This is a chronic problem. The current episode started more than 1 year ago. Associated symptoms include blurred vision. Pertinent negatives include no chest pain, headaches, palpitations or shortness of breath. Risk factors for coronary artery disease include dyslipidemia, diabetes mellitus, family history, obesity, sedentary lifestyle and smoking/tobacco exposure.  Hyperlipidemia  This is a chronic problem. The current episode started more than 1 year ago. The  problem is uncontrolled. Exacerbating diseases include diabetes and obesity. Pertinent negatives include no chest pain, myalgias or shortness of breath. She is currently on no antihyperlipidemic treatment. Risk factors for coronary artery disease include dyslipidemia, diabetes mellitus, hypertension, obesity, a sedentary lifestyle and family history.      Review of Systems  Constitutional: Negative for chills and fever.  Eyes: Positive for blurred vision.  Respiratory: Negative for cough and shortness of breath.   Cardiovascular: Negative for chest pain and palpitations.       No Shortness of breath  Gastrointestinal: Negative for abdominal pain, diarrhea, nausea and vomiting.  Endocrine: Positive for polydipsia and polyuria.  Genitourinary: Negative for frequency, hematuria and urgency.  Musculoskeletal: Negative for myalgias.  Skin: Negative for rash.  Neurological: Negative for tremors, seizures and headaches.  Hematological: Does not bruise/bleed easily.  Psychiatric/Behavioral: Negative for hallucinations and suicidal ideas.    Objective:    BP (!) 148/89   Pulse 90   Ht 5' 3"  (1.6 m)   Wt 216 lb (98 kg)   BMI 38.26 kg/m   Wt Readings from Last 3 Encounters:  12/11/17 216 lb (98 kg)  10/03/16 217 lb 3.2 oz (98.5 kg)  06/20/16 214 lb 12.8 oz (97.4 kg)     Physical Exam  Constitutional: She is oriented to person, place, and time. She appears well-developed.  HENT:  Head: Normocephalic and atraumatic.  Eyes: EOM are normal.  Neck: Normal range of motion. Neck supple. No tracheal deviation present. No thyromegaly present.  Cardiovascular: Normal rate and regular rhythm.  Pulmonary/Chest: Effort normal and breath sounds normal.  Abdominal: Soft. Bowel sounds are normal. There is no tenderness. There is no guarding.  Musculoskeletal: Normal range of motion. She exhibits no edema.  Neurological: She is alert and oriented to person, place, and time. She has normal reflexes.  No cranial nerve deficit. Coordination normal.  Skin: Skin is warm and dry. No rash noted. No erythema. No pallor.  Psychiatric: She has a normal mood and affect. Judgment normal.    CMP ( most recent) CMP     Component Value Date/Time   NA 138 10/03/2016 1505   K 4.8 10/03/2016 1505   CL 99 10/03/2016 1505   CO2 23 10/03/2016 1505   GLUCOSE 226 (H) 10/03/2016 1505   BUN 10 10/03/2016 1505   CREATININE 0.49 (L) 10/03/2016 1505   CALCIUM 9.8 10/03/2016 1505   PROT 7.4 10/03/2016 1505   ALBUMIN 4.3 10/03/2016 1505   AST 18 10/03/2016 1505   ALT 18 10/03/2016 1505   ALKPHOS 89 10/03/2016 1505   BILITOT <0.2 10/03/2016 1505   GFRNONAA 123 10/03/2016 1505   GFRAA 142 10/03/2016 1505     Diabetic Labs (most recent): Lab Results  Component Value Date   HGBA1C 11.4 (A) 05/13/2017   HGBA1C 13.9 (A) 01/18/2017   HGBA1C 11.3 (A) 10/03/2016     Lipid Panel ( most recent) Lipid Panel     Component Value Date/Time   CHOL 187 10/03/2016 1505   TRIG 162 (H) 10/03/2016 1505   HDL 31 (L) 10/03/2016 1505   CHOLHDL 6.0 (H) 10/03/2016 1505   LDLCALC 124 (H) 10/03/2016 1505      Lab Results  Component Value Date   TSH 1.200 10/06/2015   TSH 1.320 10/25/2014     Assessment & Plan:   1. Uncontrolled type 2 diabetes mellitus with hyperglycemia ([place])  - Yanique Mulvihill has chronically  uncontrolled symptomatic type 2 DM since 42 years of age,  with most recent A1c of 11.4 %.  She did not have A1c lower than 9% in at least 2 years.  He does not have recent labs to review.   -her diabetes is complicated by documented history of noncompliance/nonadherence, obesity/sedentary life and Koda Defrank remains at extremely high risk for more acute and chronic complications which include CAD, CVA, CKD, retinopathy, and neuropathy. These are all discussed in detail with the patient.  - I have counseled her on diet management and weight loss, by adopting a carbohydrate restricted/protein  rich diet.  - Suggestion is made for her to avoid simple carbohydrates  from her diet including Cakes, Sweet Desserts, Ice Cream, Soda (diet and regular), Sweet Tea, Candies, Chips, Cookies, Store Bought Juices, Alcohol  in Excess of  1-2 drinks a day, Artificial Sweeteners, and "Sugar-free" Products. This will help patient to have stable blood glucose profile and potentially avoid unintended weight gain.  - I encouraged her to switch to  unprocessed or minimally processed complex starch and increased protein intake (animal or plant source), fruits, and vegetables.  - she is advised to stick to a routine mealtimes to eat 3 meals  a day and avoid unnecessary snacks ( to snack only to correct hypoglycemia).   - she will be scheduled with Jearld Fenton, RDN, CDE for individualized diabetes education.  - I have approached her with the following individualized plan to manage diabetes and patient agrees:   -Given his current and prevailing glycemic burden, this patient will require intensive treatment with basal/bolus insulin in order for her to achieve and maintain control of diabetes to target.   -However, it is essential to assure her commitment for proper monitoring of blood glucose for safe use of insulin.  -In preparation, I approached her to initiate strict monitoring of glucose 4 times a day-before meals and at bedtime, and return in 10 days with her meter and logs for reevaluation. -In the meantime, I advised her to increase her Lantus to 50 units nightly. -Patient is encouraged to call clinic for blood glucose levels less than 70 or above 300 mg /dl. - I will continue metformin 1000 mg p.o. twice daily after meals, therapeutically suitable for patient .   - she will be considered for incretin therapy as appropriate next visit. - Patient specific target  A1c;  LDL, HDL, Triglycerides, and  Waist Circumference were discussed in detail.  2) BP/HTN: Her blood pressure is not controlled to  target.  She is on metoprolol 25 mg p.o. daily, lisinopril-hydrochlorothiazide 20-12.5 mg 2 tablets a day.  She is advised to maintain consistency on these medications, and salt restrictions.    3) Lipids/HPL:   Her lipid panel from June 2018 was consistent with uncontrolled LDL at 124.  She is initiated on atorvastatin 20 mg p.o. nightly.  Side effects and precautions discussed with her.  4)  Weight/Diet: CDE Consult will be initiated , exercise, and detailed carbohydrates information provided.  5) Chronic Care/Health Maintenance:  -she  is on ACEI/ARB and Statin medications and  is encouraged to initiate and continue to follow up with Ophthalmology, Dentist,  Podiatrist at least yearly or according to recommendations, and advised to  stay away from smoking. I have recommended yearly flu vaccine and pneumonia vaccine at least every 5 years; moderate intensity exercise for up to 150 minutes weekly; and  sleep for at least 7 hours a day.  - I advised patient to maintain close follow up with No primary care provider on file. for primary care needs.  - Time spent with the patient: 45 minutes, of which >50% was spent in obtaining information about her symptoms, reviewing her previous labs, evaluations, and treatments, counseling her about her currently uncontrolled type 2 diabetes, hyperlipidemia, hypertension, obesity/sedentary life, and developing developing  plans for long term treatment based on the latest recommendations.  Arnette Felts participated in the discussions, expressed understanding, and voiced agreement with the above plans.  All questions were answered to her satisfaction. she is encouraged to contact clinic should she have any questions or concerns prior to her return visit.  Follow up plan: - Return in about 10 days (around 12/21/2017) for Follow up with Meter and Logs Only - no Labs.  Glade Lloyd, MD [facility] [address] [place], [place] [address] Phone: [phone]  Fax: [phone]    12/12/2017, 9:16 AM  This note was partially dictated with voice recognition software. Similar sounding words can be transcribed inadequately or may not  be corrected upon review.

## 2017-12-23 ENCOUNTER — Ambulatory Visit: Payer: Medicaid Other | Admitting: "Endocrinology

## 2017-12-23 ENCOUNTER — Encounter: Payer: Self-pay | Admitting: "Endocrinology

## 2019-09-09 ENCOUNTER — Ambulatory Visit: Payer: Medicaid Other | Admitting: "Endocrinology

## 2021-11-21 ENCOUNTER — Other Ambulatory Visit: Payer: Self-pay | Admitting: Family

## 2021-11-21 ENCOUNTER — Ambulatory Visit
Admission: RE | Admit: 2021-11-21 | Discharge: 2021-11-21 | Disposition: A | Discharge status: Home / Self Care | Payer: Medicaid Other | Source: Ambulatory Visit | Attending: Family | Admitting: Family

## 2021-11-21 DIAGNOSIS — Z1231 Encounter for screening mammogram for malignant neoplasm of breast: Secondary | ICD-10-CM

## 2021-11-29 ENCOUNTER — Other Ambulatory Visit: Payer: Self-pay | Admitting: Family

## 2021-11-29 DIAGNOSIS — R928 Other abnormal and inconclusive findings on diagnostic imaging of breast: Secondary | ICD-10-CM

## 2022-02-06 ENCOUNTER — Other Ambulatory Visit: Payer: Self-pay | Admitting: Family Medicine

## 2022-02-06 DIAGNOSIS — R928 Other abnormal and inconclusive findings on diagnostic imaging of breast: Secondary | ICD-10-CM

## 2022-02-12 ENCOUNTER — Inpatient Hospital Stay: Admission: RE | Admit: 2022-02-12 | Payer: Medicaid Other | Source: Ambulatory Visit

## 2022-02-12 ENCOUNTER — Other Ambulatory Visit: Payer: Medicaid Other
